# Patient Record
Sex: Female | Born: 1940 | Race: Black or African American | Hispanic: No | Smoking: Never smoker
Health system: Southern US, Community
[De-identification: ages and names within clinical notes are randomized; demographics above are authoritative.]

## PROBLEM LIST (undated history)

## (undated) DIAGNOSIS — M1990 Unspecified osteoarthritis, unspecified site: Secondary | ICD-10-CM

## (undated) DIAGNOSIS — M1711 Unilateral primary osteoarthritis, right knee: Secondary | ICD-10-CM

## (undated) DIAGNOSIS — E119 Type 2 diabetes mellitus without complications: Secondary | ICD-10-CM

## (undated) DIAGNOSIS — I10 Essential (primary) hypertension: Secondary | ICD-10-CM

## (undated) DIAGNOSIS — K589 Irritable bowel syndrome without diarrhea: Secondary | ICD-10-CM

## (undated) HISTORY — PX: MYOMECTOMY: SHX85

## (undated) HISTORY — PX: TRIGGER FINGER RELEASE: SHX641

## (undated) HISTORY — PX: EYE SURGERY: SHX253

## (undated) HISTORY — PX: BUNIONECTOMY: SHX129

## (undated) HISTORY — PX: ABDOMINAL HYSTERECTOMY: SHX81

## (undated) HISTORY — PX: BREAST SURGERY: SHX581

---

## 2010-01-19 DEATH — deceased

## 2017-07-03 ENCOUNTER — Other Ambulatory Visit: Payer: Self-pay | Admitting: Orthopedic Surgery

## 2017-07-19 NOTE — Pre-Procedure Instructions (Addendum)
Vanessa Hamilton  07/19/2017      [id number] - [place], [place] - [address] AT [facility] [address] [place] [address] Phone: [phone] Fax: [phone]    Your procedure is scheduled on Fri., August 02, 2017 from 9:30AM-11:15AM  Report to [facility] Admitting Entrance "A" at 7:30AM  Call this number if you have problems the morning of surgery:  [phone]   Remember:  No food or drinks after midnight on June 13th   Take these medicines the morning of surgery with A SIP OF WATER: AmLODipine (NORVASC) and Timolol (TIMOPTIC-XR)   7 days before surgery (6/7), stop taking all Other Aspirin Products, Vitamins, Fish oils, and Herbal medications. Also stop all NSAIDS i.e. Advil, Ibuprofen, Motrin, Aleve, Anaprox, Naproxen, BC and Goody Powders.  How to Manage Your Diabetes Before and After Surgery  Why is it important to control my blood sugar before and after surgery? . Improving blood sugar levels before and after surgery helps healing and can limit problems. . A way of improving blood sugar control is eating a healthy diet by: o  Eating less sugar and carbohydrates o  Increasing activity/exercise o  Talking with your doctor about reaching your blood sugar goals . High blood sugars (greater than 180 mg/dL) can raise your risk of infections and slow your recovery, so you will need to focus on controlling your diabetes during the weeks before surgery. . Make sure that the doctor who takes care of your diabetes knows about your planned surgery including the date and location.  How do I manage my blood sugar before surgery? . Check your blood sugar at least 4 times a day, starting 2 days before surgery, to make sure that the level is not too high or low. o Check your blood sugar the morning of your surgery when you wake up and every 2 hours until you get to the Short Stay unit. . If your  blood sugar is less than 70 mg/dL, you will need to treat for low blood sugar: o Do not take insulin. o Treat a low blood sugar (less than 70 mg/dL) with  cup of clear juice (cranberry or apple), 4 glucose tablets, OR glucose gel. Recheck blood sugar in 15 minutes after treatment (to make sure it is greater than 70 mg/dL). If your blood sugar is not greater than 70 mg/dL on recheck, call [phone] o  for further instructions. . Report your blood sugar to the short stay nurse when you get to Short Stay.  . If you are admitted to the hospital after surgery: o Your blood sugar will be checked by the staff and you will probably be given insulin after surgery (instead of oral diabetes medicines) to make sure you have good blood sugar levels. o The goal for blood sugar control after surgery is 80-180 mg/dL.  WHAT DO I DO ABOUT MY DIABETES MEDICATION?  Marland Kitchen Do not take MetFORMIN (GLUCOPHAGE) the morning of surgery.  . If your CBG is greater than 220 mg/dL, call us at [phone]  Reviewed and Endorsed by [facility] Patient Education Committee, August 2015    Do not wear jewelry, make-up or nail polish (fingers).  Do not wear lotions, powders, or perfumes, or deodorant.  Do not shave 48 hours prior to surgery.    Do not bring valuables to the hospital.  [facility] is not responsible for any belongings or  valuables.  Contacts, dentures or bridgework may not be worn into surgery.  Leave your suitcase in the car.  After surgery it may be brought to your room.  For patients admitted to the hospital, discharge time will be determined by your treatment team.  Patients discharged the day of surgery will not be allowed to drive home.   Special instructions:   [facility]- Preparing For Surgery  Before surgery, you can play an important role. Because skin is not sterile, your skin needs to be as free of germs as possible. You can reduce the number of germs on your skin by washing with CHG  (chlorahexidine gluconate) Soap before surgery.  CHG is an antiseptic cleaner which kills germs and bonds with the skin to continue killing germs even after washing.    Oral Hygiene is also important to reduce your risk of infection.  Remember - BRUSH YOUR TEETH THE MORNING OF SURGERY WITH YOUR REGULAR TOOTHPASTE  Please do not use if you have an allergy to CHG or antibacterial soaps. If your skin becomes reddened/irritated stop using the CHG.  Do not shave (including legs and underarms) for at least 48 hours prior to first CHG shower. It is OK to shave your face.  Please follow these instructions carefully.   1. Shower the NIGHT BEFORE SURGERY and the MORNING OF SURGERY with CHG.   2. If you chose to wash your hair, wash your hair first as usual with your normal shampoo.  3. After you shampoo, rinse your hair and body thoroughly to remove the shampoo.  4. Use CHG as you would any other liquid soap. You can apply CHG directly to the skin and wash gently with a scrungie or a clean washcloth.   5. Apply the CHG Soap to your body ONLY FROM THE NECK DOWN.  Do not use on open wounds or open sores. Avoid contact with your eyes, ears, mouth and genitals (private parts). Wash Face and genitals (private parts)  with your normal soap.  6. Wash thoroughly, paying special attention to the area where your surgery will be performed.  7. Thoroughly rinse your body with warm water from the neck down.  8. DO NOT shower/wash with your normal soap after using and rinsing off the CHG Soap.  9. Pat yourself dry with a CLEAN TOWEL.  10. Wear CLEAN PAJAMAS to bed the night before surgery, wear comfortable clothes the morning of surgery  11. Place CLEAN SHEETS on your bed the night of your first shower and DO NOT SLEEP WITH PETS.  Day of Surgery:  Do not apply any deodorants/lotions.  Please wear clean clothes to the hospital/surgery center.   Remember to brush your teeth WITH YOUR REGULAR  TOOTHPASTE.  Please read over the following fact sheets that you were given. Pain Booklet, Coughing and Deep Breathing, MRSA Information and Surgical Site Infection Prevention

## 2017-07-22 ENCOUNTER — Encounter (HOSPITAL_COMMUNITY): Payer: Self-pay

## 2017-07-22 ENCOUNTER — Other Ambulatory Visit: Payer: Self-pay

## 2017-07-22 ENCOUNTER — Ambulatory Visit (HOSPITAL_COMMUNITY)
Admission: RE | Admit: 2017-07-22 | Discharge: 2017-07-22 | Disposition: A | Discharge status: Home / Self Care | Payer: Medicare Other | Source: Ambulatory Visit | Attending: Orthopedic Surgery | Admitting: Orthopedic Surgery

## 2017-07-22 ENCOUNTER — Encounter (HOSPITAL_COMMUNITY)
Admission: RE | Admit: 2017-07-22 | Discharge: 2017-07-22 | Disposition: A | Discharge status: Home / Self Care | Payer: Medicare Other | Source: Ambulatory Visit | Attending: Orthopedic Surgery | Admitting: Orthopedic Surgery

## 2017-07-22 DIAGNOSIS — Z01818 Encounter for other preprocedural examination: Secondary | ICD-10-CM | POA: Diagnosis present

## 2017-07-22 DIAGNOSIS — I498 Other specified cardiac arrhythmias: Secondary | ICD-10-CM | POA: Diagnosis not present

## 2017-07-22 DIAGNOSIS — R9431 Abnormal electrocardiogram [ECG] [EKG]: Secondary | ICD-10-CM | POA: Diagnosis not present

## 2017-07-22 DIAGNOSIS — E119 Type 2 diabetes mellitus without complications: Secondary | ICD-10-CM | POA: Diagnosis not present

## 2017-07-22 HISTORY — DX: Essential (primary) hypertension: I10

## 2017-07-22 HISTORY — DX: Irritable bowel syndrome without diarrhea: K58.9

## 2017-07-22 HISTORY — DX: Unilateral primary osteoarthritis, right knee: M17.11

## 2017-07-22 HISTORY — DX: Unspecified osteoarthritis, unspecified site: M19.90

## 2017-07-22 HISTORY — DX: Type 2 diabetes mellitus without complications: E11.9

## 2017-07-22 LAB — URINALYSIS, ROUTINE W REFLEX MICROSCOPIC
Bilirubin Urine: NEGATIVE
GLUCOSE, UA: NEGATIVE mg/dL
Hgb urine dipstick: NEGATIVE
KETONES UR: NEGATIVE mg/dL
Nitrite: NEGATIVE
PROTEIN: NEGATIVE mg/dL
Specific Gravity, Urine: 1.02 (ref 1.005–1.030)
pH: 5 (ref 5.0–8.0)

## 2017-07-22 LAB — BASIC METABOLIC PANEL
Anion gap: 8 (ref 5–15)
BUN: 7 mg/dL (ref 6–20)
CALCIUM: 9.9 mg/dL (ref 8.9–10.3)
CO2: 28 mmol/L (ref 22–32)
CREATININE: 0.64 mg/dL (ref 0.44–1.00)
Chloride: 108 mmol/L (ref 101–111)
Glucose, Bld: 107 mg/dL — ABNORMAL HIGH (ref 65–99)
Potassium: 3.7 mmol/L (ref 3.5–5.1)
SODIUM: 144 mmol/L (ref 135–145)

## 2017-07-22 LAB — CBC WITH DIFFERENTIAL/PLATELET
Abs Immature Granulocytes: 0 10*3/uL (ref 0.0–0.1)
BASOS ABS: 0.1 10*3/uL (ref 0.0–0.1)
BASOS PCT: 1 %
EOS ABS: 0.2 10*3/uL (ref 0.0–0.7)
Eosinophils Relative: 3 %
HCT: 41.2 % (ref 36.0–46.0)
Hemoglobin: 13.3 g/dL (ref 12.0–15.0)
IMMATURE GRANULOCYTES: 0 %
Lymphocytes Relative: 48 %
Lymphs Abs: 2.6 10*3/uL (ref 0.7–4.0)
MCH: 29.6 pg (ref 26.0–34.0)
MCHC: 32.3 g/dL (ref 30.0–36.0)
MCV: 91.6 fL (ref 78.0–100.0)
MONOS PCT: 7 %
Monocytes Absolute: 0.4 10*3/uL (ref 0.1–1.0)
Neutro Abs: 2.2 10*3/uL (ref 1.7–7.7)
Neutrophils Relative %: 41 %
PLATELETS: 276 10*3/uL (ref 150–400)
RBC: 4.5 MIL/uL (ref 3.87–5.11)
RDW: 13.2 % (ref 11.5–15.5)
WBC: 5.3 10*3/uL (ref 4.0–10.5)

## 2017-07-22 LAB — SURGICAL PCR SCREEN
MRSA, PCR: NEGATIVE
Staphylococcus aureus: NEGATIVE

## 2017-07-22 LAB — APTT: APTT: 27 s (ref 24–36)

## 2017-07-22 LAB — ABO/RH: ABO/RH(D): O POS

## 2017-07-22 LAB — TYPE AND SCREEN
ABO/RH(D): O POS
Antibody Screen: NEGATIVE

## 2017-07-22 LAB — GLUCOSE, CAPILLARY: GLUCOSE-CAPILLARY: 98 mg/dL (ref 65–99)

## 2017-07-22 LAB — HEMOGLOBIN A1C
Hgb A1c MFr Bld: 6.3 % — ABNORMAL HIGH (ref 4.8–5.6)
Mean Plasma Glucose: 134.11 mg/dL

## 2017-07-22 LAB — PROTIME-INR
INR: 0.98
PROTHROMBIN TIME: 12.9 s (ref 11.4–15.2)

## 2017-07-22 NOTE — Progress Notes (Signed)
PCP - [facility]inda [facility] [place]  Cardiologist - Denies  Chest x-ray - 07/22/17  EKG - 07/22/17  Stress Test - Denies  ECHO - Denies  Cardiac Cath - Denies  Sleep Study - Denies CPAP - None  LABS- 07/22/17: CBC w/D, BMP, PT, PTT, T/S, UA  ASA- Denies  HA1C- 07/22/17 Fasting Blood Sugar - 90-117, Today, 98 Checks Blood Sugar __3___ times a week  Anesthesia- No  Pt denies having chest pain, sob, or fever at this time. All instructions explained to the pt, with a verbal understanding of the material. Pt agrees to go over the instructions while at home for a better understanding. The opportunity to ask questions was provided.

## 2017-07-22 NOTE — Progress Notes (Signed)
Cathy from Dr. Wadie Lessenowan's office made aware of abnormal urine results.

## 2017-07-30 ENCOUNTER — Telehealth (HOSPITAL_COMMUNITY): Payer: Self-pay | Admitting: *Deleted

## 2017-07-30 ENCOUNTER — Ambulatory Visit (INDEPENDENT_AMBULATORY_CARE_PROVIDER_SITE_OTHER): Payer: Medicare Other | Admitting: Cardiology

## 2017-07-30 ENCOUNTER — Encounter: Payer: Self-pay | Admitting: Cardiology

## 2017-07-30 VITALS — BP 132/86 | HR 53 | Ht 66.0 in | Wt 203.0 lb

## 2017-07-30 DIAGNOSIS — R9431 Abnormal electrocardiogram [ECG] [EKG]: Secondary | ICD-10-CM

## 2017-07-30 DIAGNOSIS — E782 Mixed hyperlipidemia: Secondary | ICD-10-CM

## 2017-07-30 DIAGNOSIS — O24919 Unspecified diabetes mellitus in pregnancy, unspecified trimester: Secondary | ICD-10-CM

## 2017-07-30 DIAGNOSIS — M1711 Unilateral primary osteoarthritis, right knee: Secondary | ICD-10-CM | POA: Diagnosis present

## 2017-07-30 DIAGNOSIS — Z01810 Encounter for preprocedural cardiovascular examination: Secondary | ICD-10-CM | POA: Diagnosis not present

## 2017-07-30 DIAGNOSIS — I10 Essential (primary) hypertension: Secondary | ICD-10-CM

## 2017-07-30 NOTE — H&P (Signed)
TOTAL KNEE ADMISSION H&P  Patient is being admitted for right total knee arthroplasty.  Subjective:  Chief Complaint:right knee pain.  HPI: Vanessa Hamilton, 77 y.o. female, has a history of pain and functional disability in the right knee due to arthritis and has failed non-surgical conservative treatments for greater than 12 weeks to includeNSAID's and/or analgesics, corticosteriod injections, flexibility and strengthening excercises, use of assistive devices and activity modification.  Onset of symptoms was gradual, starting several years ago with gradually worsening course since that time. The patient noted no past surgery on the right knee(s).  Patient currently rates pain in the right knee(s) at 10 out of 10 with activity. Patient has night pain, worsening of pain with activity and weight bearing, pain that interferes with activities of daily living, pain with passive range of motion and crepitus.  Patient has evidence of joint space narrowing by imaging studies.   There is no active infection.  There are no active problems to display for this patient.  Past Medical History:  Diagnosis Date  . Arthritis   . Diabetes mellitus without complication (HCC)    Type II  . Hypertension   . IBS (irritable bowel syndrome)   . Osteoarthritis of right knee     Past Surgical History:  Procedure Laterality Date  . ABDOMINAL HYSTERECTOMY    . BREAST SURGERY     Left biopsy x2  . BUNIONECTOMY     Left Great Toe  . EYE SURGERY     Left Cataract  . MYOMECTOMY    . TRIGGER FINGER RELEASE     Right hand    No current facility-administered medications for this encounter.    Current Outpatient Medications  Medication Sig Dispense Refill Last Dose  . acetaminophen (TYLENOL 8 HOUR ARTHRITIS PAIN) 650 MG CR tablet Take 650 mg by mouth at bedtime as needed for pain.     Marland Kitchen amLODipine (NORVASC) 5 MG tablet Take 5 mg by mouth daily.     . metFORMIN (GLUCOPHAGE) 500 MG tablet Take 1,000 mg by mouth 2 (two)  times daily.     . Multiple Vitamin (MULTIVITAMIN WITH MINERALS) TABS tablet Take 1 tablet by mouth daily.     . Omega-3 Fatty Acids (FISH OIL) 1200 MG CAPS Take 1,200 mg by mouth daily.     . pravastatin (PRAVACHOL) 80 MG tablet Take 80 mg by mouth at bedtime.     . timolol (TIMOPTIC-XR) 0.5 % ophthalmic gel-forming Place 1 drop into the right eye daily.     . Travoprost, BAK Free, (TRAVATAN) 0.004 % SOLN ophthalmic solution Place 1 drop into both eyes at bedtime.      Allergies  Allergen Reactions  . Losartan Swelling  . Ramipril Swelling    Social History   Tobacco Use  . Smoking status: Never Smoker  . Smokeless tobacco: Former Neurosurgeon    Types: Snuff  Substance Use Topics  . Alcohol use: Not Currently    No family history on file.   Review of Systems  Constitutional: Negative.   HENT: Positive for sinus pain.   Eyes: Negative.   Respiratory: Negative.   Cardiovascular:       HTN  Gastrointestinal: Negative.   Genitourinary: Negative.   Musculoskeletal: Positive for joint pain.  Skin: Negative.   Neurological: Negative.   Endo/Heme/Allergies:       Diabetes  Psychiatric/Behavioral: Negative.     Objective:  Physical Exam  Constitutional: She is oriented to person, place, and time. She appears  well-developed and well-nourished.  HENT:  Head: Normocephalic and atraumatic.  Neck: Normal range of motion. Neck supple.  Cardiovascular: Intact distal pulses.  Respiratory: Effort normal.  Musculoskeletal: She exhibits tenderness.  the patient has good strength and good range of motion in the left knee.  The patient's right knee has a range from 5 to 110.  She does have mild swelling.  No erythema or warmth.  Moderate crepitance with range of motion.  She has tenderness over the lateral joint line.  No instability with valgus or varus stress.  Her calves are soft and nontender.  Neurological: She is alert and oriented to person, place, and time.  Skin: Skin is warm and  dry.  Psychiatric: She has a normal mood and affect. Her behavior is normal. Judgment and thought content normal.    Vital signs in last 24 hours:    Labs:   Estimated body mass index is 32.62 kg/m as calculated from the following:   Height as of 07/22/17: 5\' 6"  (1.676 m).   Weight as of 07/22/17: 91.7 kg (202 lb 1.6 oz).   Imaging Review Plain radiographs demonstrate bilateral AP weightbearing, bilateral Rosenberg, lateral and sunrise views of the right and left knees are taken and reviewed in office today.  This demonstrates near end-stage arthritis lateral compartment right knee.    Preoperative templating of the joint replacement has been completed, documented, and submitted to the Operating Room personnel in order to optimize intra-operative equipment management.   Anticipated LOS equal to or greater than 2 midnights due to - Age 78 and older with one or more of the following:  - Obesity  - Expected need for hospital services (PT, OT, Nursing) required for safe  discharge  - Anticipated need for postoperative skilled nursing care or inpatient rehab  - Active co-morbidities: Diabetes   Assessment/Plan:  End stage arthritis, right knee   The patient history, physical examination, clinical judgment of the provider and imaging studies are consistent with end stage degenerative joint disease of the right knee(s) and total knee arthroplasty is deemed medically necessary. The treatment options including medical management, injection therapy arthroscopy and arthroplasty were discussed at length. The risks and benefits of total knee arthroplasty were presented and reviewed. The risks due to aseptic loosening, infection, stiffness, patella tracking problems, thromboembolic complications and other imponderables were discussed. The patient acknowledged the explanation, agreed to proceed with the plan and consent was signed. Patient is being admitted for inpatient treatment for surgery, pain  control, PT, OT, prophylactic antibiotics, VTE prophylaxis, progressive ambulation and ADL's and discharge planning. The patient is planning to be discharged home with home health services.

## 2017-07-30 NOTE — Progress Notes (Signed)
Cardiology Office Note:    Date:  07/30/2017   ID:  Vanessa Hamilton, DOB 13-May-1940, MRN [id number]  PCP:  Nicolasa Ducking, MD  Cardiologist:  Garwin Brothers, MD   Referring MD: Nicolasa Ducking, MD    ASSESSMENT:    1. Pre-operative cardiovascular examination   2. Essential hypertension   3. Diabetes mellitus affecting pregnancy, unspecified trimester   4. Mixed hyperlipidemia   5. Abnormal EKG    PLAN:    In order of problems listed above:  1. Primary prevention stressed with the patient.  Importance of compliance with diet and medications stressed and she vocalized understanding.  Weight reduction was stressed.  Her blood pressure is stable. 2. Diet was discussed with dyslipidemia and diabetes mellitus and she vocalized understanding.  These issues are managed by her primary care physician. 3. In view of multiple risk factors for coronary artery disease and sedentary lifestyle, she will undergo Lexiscan sestamibi.  If this is negative she is not at high risk for coronary events during the aforementioned surgery.  Meticulous hemodynamic monitoring will further reduce the risk of coronary events.  She will be seen in follow-up on a as needed basis only.  Her other healthcare maintenance issues mentioned above will be managed by her primary care physician.   Medication Adjustments/Labs and Tests Ordered: Current medicines are reviewed at length with the patient today.  Concerns regarding medicines are outlined above.  Orders Placed This Encounter  Procedures  . MYOCARDIAL PERFUSION IMAGING   No orders of the defined types were placed in this encounter.    History of Present Illness:    Vanessa Hamilton is a 77 y.o. female who is being seen today for the evaluation of preop cardiovascular risk assessment at the request of Shoffeitt, John, MD.  Patient is a pleasant 77 year old female.  She has past medical history of essential hypertension diabetes mellitus and dyslipidemia.  She is  planning to undergo knee surgery.  Obviously she leads a very sedentary lifestyle.  She denies any chest pain orthopnea or PND but she is very sedentary.  She was evaluated by anesthesia for preop assessment and had an abnormal EKG and therefore sent here for evaluation.  No history of syncope.  At the time of my evaluation, the patient is alert awake oriented and in no distress.  Past Medical History:  Diagnosis Date  . Arthritis   . Diabetes mellitus without complication (HCC)    Type II  . Hypertension   . IBS (irritable bowel syndrome)   . Osteoarthritis of right knee     Past Surgical History:  Procedure Laterality Date  . ABDOMINAL HYSTERECTOMY    . BREAST SURGERY     Left biopsy x2  . BUNIONECTOMY     Left Great Toe  . EYE SURGERY     Left Cataract  . MYOMECTOMY    . TRIGGER FINGER RELEASE     Right hand    Current Medications: Current Meds  Medication Sig  . acetaminophen (TYLENOL 8 HOUR ARTHRITIS PAIN) 650 MG CR tablet Take 650 mg by mouth at bedtime as needed for pain.  Marland Kitchen amLODipine (NORVASC) 5 MG tablet Take 5 mg by mouth daily.  . metFORMIN (GLUCOPHAGE) 500 MG tablet Take 1,000 mg by mouth 2 (two) times daily.  . Multiple Vitamin (MULTIVITAMIN WITH MINERALS) TABS tablet Take 1 tablet by mouth daily.  . Omega-3 Fatty Acids (FISH OIL) 1200 MG CAPS Take 1,200 mg by mouth daily.  . pravastatin (PRAVACHOL)  80 MG tablet Take 80 mg by mouth at bedtime.  . timolol (TIMOPTIC-XR) 0.5 % ophthalmic gel-forming Place 1 drop into the right eye daily.  . Travoprost, BAK Free, (TRAVATAN) 0.004 % SOLN ophthalmic solution Place 1 drop into both eyes at bedtime.     Allergies:   Atorvastatin; Losartan; and Ramipril   Social History   Socioeconomic History  . Marital status: Widowed    Spouse name: Not on file  . Number of children: Not on file  . Years of education: Not on file  . Highest education level: Not on file  Occupational History  . Not on file  Social Needs  .  Financial resource strain: Not on file  . Food insecurity:    Worry: Not on file    Inability: Not on file  . Transportation needs:    Medical: Not on file    Non-medical: Not on file  Tobacco Use  . Smoking status: Never Smoker  . Smokeless tobacco: Former NeurosurgeonUser    Types: Snuff  Substance and Sexual Activity  . Alcohol use: Not Currently  . Drug use: Never  . Sexual activity: Not on file  Lifestyle  . Physical activity:    Days per week: Not on file    Minutes per session: Not on file  . Stress: Not on file  Relationships  . Social connections:    Talks on phone: Not on file    Gets together: Not on file    Attends religious service: Not on file    Active member of club or organization: Not on file    Attends meetings of clubs or organizations: Not on file    Relationship status: Not on file  Other Topics Concern  . Not on file  Social History Narrative  . Not on file     Family History: The patient's family history includes Breast cancer in her sister.  ROS:   Please see the history of present illness.    All other systems reviewed and are negative.  EKGs/Labs/Other Studies Reviewed:    The following studies were reviewed today: EKG reveals sinus rhythm poor anterior forces and nonspecific ST-T changes.   Recent Labs: 07/22/2017: BUN 7; Creatinine, Ser 0.64; Hemoglobin 13.3; Platelets 276; Potassium 3.7; Sodium 144  Recent Lipid Panel No results found for: CHOL, TRIG, HDL, CHOLHDL, VLDL, LDLCALC, LDLDIRECT  Physical Exam:    VS:  BP 132/86 (BP Location: Right Arm, Patient Position: Sitting, Cuff Size: Normal)   Pulse (!) 53   Ht 5\' 6"  (1.676 m)   Wt 203 lb (92.1 kg)   SpO2 98%   BMI 32.77 kg/m     Wt Readings from Last 3 Encounters:  07/30/17 203 lb (92.1 kg)  07/22/17 202 lb 1.6 oz (91.7 kg)     GEN: Patient is in no acute distress HEENT: Normal NECK: No JVD; No carotid bruits LYMPHATICS: No lymphadenopathy CARDIAC: S1 S2 regular, 2/6 systolic  murmur at the apex. RESPIRATORY:  Clear to auscultation without rales, wheezing or rhonchi  ABDOMEN: Soft, non-tender, non-distended MUSCULOSKELETAL:  No edema; No deformity  SKIN: Warm and dry NEUROLOGIC:  Alert and oriented x 3 PSYCHIATRIC:  Normal affect    Signed, Garwin Brothersajan R Revankar, MD  07/30/2017 9:57 AM    [facility]

## 2017-07-30 NOTE — Telephone Encounter (Signed)
Patient given detailed instructions per Myocardial Perfusion Study Information Sheet for the test on 07/31/17 at 1000. Patient notified to arrive 15 minutes early and that it is imperative to arrive on time for appointment to keep from having the test rescheduled.  If you need to cancel or reschedule your appointment, please call the office within 24 hours of your appointment. . Patient verbalized understanding.E.Kubak,CNMT

## 2017-07-30 NOTE — Patient Instructions (Signed)
Medication Instructions:  Your physician recommends that you continue on your current medications as directed. Please refer to the Current Medication list given to you today.  Labwork: None  Testing/Procedures: Your physician has requested that you have a lexiscan myoview. For further information please visit [email or web address]. Please follow instruction sheet, as given.  Follow-Up: Your physician recommends that you schedule a follow-up appointment in: as needed  Any Other Special Instructions Will Be Listed Below (If Applicable).     If you need a refill on your cardiac medications before your next appointment, please call your pharmacy.   [facility]  Ashley A, RN, BSN  

## 2017-07-30 NOTE — Telephone Encounter (Signed)
Left message on voicemail in reference to upcoming appointment scheduled for 07/31/17. Cell # does not have vmail set up.Phone number given for a call back so details instructions can be given. Davis Ambrosini, Adelene IdlerCynthia W

## 2017-07-31 ENCOUNTER — Ambulatory Visit (HOSPITAL_BASED_OUTPATIENT_CLINIC_OR_DEPARTMENT_OTHER): Payer: Medicare Other

## 2017-07-31 VITALS — Ht 66.0 in | Wt 203.0 lb

## 2017-07-31 DIAGNOSIS — Z01810 Encounter for preprocedural cardiovascular examination: Secondary | ICD-10-CM

## 2017-07-31 DIAGNOSIS — R9431 Abnormal electrocardiogram [ECG] [EKG]: Secondary | ICD-10-CM

## 2017-07-31 LAB — MYOCARDIAL PERFUSION IMAGING
CHL CUP NUCLEAR SRS: 11
CSEPPHR: 84 {beats}/min
LV dias vol: 78 mL (ref 46–106)
LV sys vol: 30 mL
RATE: 0.34
Rest HR: 68 {beats}/min
SDS: 0
SSS: 8
TID: 0.95

## 2017-07-31 MED ORDER — REGADENOSON 0.4 MG/5ML IV SOLN
0.4000 mg | Freq: Once | INTRAVENOUS | Status: AC
  Administered 2017-07-31: 0.4 mg via INTRAVENOUS

## 2017-07-31 MED ORDER — TECHNETIUM TC 99M TETROFOSMIN IV KIT
10.5000 | PACK | Freq: Once | INTRAVENOUS | Status: AC | PRN
  Administered 2017-07-31: 10.5 via INTRAVENOUS
  Filled 2017-07-31: qty 11

## 2017-07-31 MED ORDER — TECHNETIUM TC 99M TETROFOSMIN IV KIT
32.5000 | PACK | Freq: Once | INTRAVENOUS | Status: AC | PRN
  Administered 2017-07-31: 32.5 via INTRAVENOUS
  Filled 2017-07-31: qty 33

## 2017-08-01 ENCOUNTER — Telehealth: Payer: Self-pay

## 2017-08-01 MED ORDER — TRANEXAMIC ACID 1000 MG/10ML IV SOLN
2000.0000 mg | INTRAVENOUS | Status: AC
  Administered 2017-08-02: 2000 mg via TOPICAL
  Filled 2017-08-01: qty 20

## 2017-08-01 MED ORDER — BUPIVACAINE LIPOSOME 1.3 % IJ SUSP
20.0000 mL | Freq: Once | INTRAMUSCULAR | Status: AC
  Administered 2017-08-02: 20 mL
  Filled 2017-08-01: qty 20

## 2017-08-01 MED ORDER — TRANEXAMIC ACID 1000 MG/10ML IV SOLN
1000.0000 mg | INTRAVENOUS | Status: AC
  Administered 2017-08-02: 1000 mg via INTRAVENOUS
  Filled 2017-08-01: qty 1100

## 2017-08-01 NOTE — Telephone Encounter (Signed)
Patient was notified of her resutls

## 2017-08-02 ENCOUNTER — Inpatient Hospital Stay (HOSPITAL_COMMUNITY)
Admission: AD | Admit: 2017-08-02 | Discharge: 2017-08-05 | DRG: 470 | Disposition: A | Discharge status: Home Health | Payer: Medicare Other | Source: Ambulatory Visit | Attending: Orthopedic Surgery | Admitting: Orthopedic Surgery

## 2017-08-02 ENCOUNTER — Encounter (HOSPITAL_COMMUNITY)
Admission: AD | Disposition: A | Discharge status: Home Health | Payer: Self-pay | Source: Ambulatory Visit | Attending: Orthopedic Surgery

## 2017-08-02 ENCOUNTER — Ambulatory Visit (HOSPITAL_COMMUNITY): Payer: Medicare Other | Admitting: Anesthesiology

## 2017-08-02 ENCOUNTER — Encounter (HOSPITAL_COMMUNITY): Payer: Self-pay

## 2017-08-02 ENCOUNTER — Ambulatory Visit (HOSPITAL_COMMUNITY): Payer: Medicare Other | Admitting: Emergency Medicine

## 2017-08-02 DIAGNOSIS — K589 Irritable bowel syndrome without diarrhea: Secondary | ICD-10-CM | POA: Diagnosis present

## 2017-08-02 DIAGNOSIS — E669 Obesity, unspecified: Secondary | ICD-10-CM | POA: Diagnosis present

## 2017-08-02 DIAGNOSIS — Z7984 Long term (current) use of oral hypoglycemic drugs: Secondary | ICD-10-CM | POA: Diagnosis not present

## 2017-08-02 DIAGNOSIS — D62 Acute posthemorrhagic anemia: Secondary | ICD-10-CM | POA: Diagnosis not present

## 2017-08-02 DIAGNOSIS — Z6832 Body mass index (BMI) 32.0-32.9, adult: Secondary | ICD-10-CM

## 2017-08-02 DIAGNOSIS — I10 Essential (primary) hypertension: Secondary | ICD-10-CM | POA: Diagnosis present

## 2017-08-02 DIAGNOSIS — E119 Type 2 diabetes mellitus without complications: Secondary | ICD-10-CM | POA: Diagnosis present

## 2017-08-02 DIAGNOSIS — M1711 Unilateral primary osteoarthritis, right knee: Secondary | ICD-10-CM | POA: Diagnosis present

## 2017-08-02 HISTORY — PX: TOTAL KNEE ARTHROPLASTY: SHX125

## 2017-08-02 LAB — GLUCOSE, CAPILLARY
GLUCOSE-CAPILLARY: 109 mg/dL — AB (ref 65–99)
GLUCOSE-CAPILLARY: 109 mg/dL — AB (ref 65–99)
GLUCOSE-CAPILLARY: 205 mg/dL — AB (ref 65–99)
GLUCOSE-CAPILLARY: 166 mg/dL — AB (ref 65–99)
Glucose-Capillary: 113 mg/dL — ABNORMAL HIGH (ref 65–99)

## 2017-08-02 LAB — HEMOGLOBIN A1C
HEMOGLOBIN A1C: 6.6 % — AB (ref 4.8–5.6)
Mean Plasma Glucose: 142.72 mg/dL

## 2017-08-02 SURGERY — ARTHROPLASTY, KNEE, TOTAL
Anesthesia: Monitor Anesthesia Care | Site: Knee | Laterality: Right

## 2017-08-02 MED ORDER — METHOCARBAMOL 500 MG PO TABS
500.0000 mg | ORAL_TABLET | Freq: Four times a day (QID) | ORAL | Status: DC | PRN
  Administered 2017-08-02 – 2017-08-04 (×4): 500 mg via ORAL
  Filled 2017-08-02 (×5): qty 1

## 2017-08-02 MED ORDER — FLEET ENEMA 7-19 GM/118ML RE ENEM: 1.0000 | ENEMA | Freq: Once | RECTAL | Status: DC | PRN

## 2017-08-02 MED ORDER — DEXTROSE 5 % IV SOLN
INTRAVENOUS | Status: DC | PRN
  Administered 2017-08-02: 20 ug/min via INTRAVENOUS

## 2017-08-02 MED ORDER — DOCUSATE SODIUM 100 MG PO CAPS
100.0000 mg | ORAL_CAPSULE | Freq: Two times a day (BID) | ORAL | Status: DC
  Administered 2017-08-04 – 2017-08-05 (×2): 100 mg via ORAL
  Filled 2017-08-02 (×5): qty 1

## 2017-08-02 MED ORDER — MENTHOL 3 MG MT LOZG: 1.0000 | LOZENGE | OROMUCOSAL | Status: DC | PRN

## 2017-08-02 MED ORDER — OXYCODONE HCL 5 MG PO TABS
5.0000 mg | ORAL_TABLET | ORAL | Status: DC | PRN
  Administered 2017-08-02 – 2017-08-03 (×2): 10 mg via ORAL
  Administered 2017-08-04: 5 mg via ORAL
  Filled 2017-08-02 (×4): qty 2
  Filled 2017-08-02: qty 1

## 2017-08-02 MED ORDER — ONDANSETRON HCL 4 MG/2ML IJ SOLN: 4.0000 mg | Freq: Four times a day (QID) | INTRAMUSCULAR | Status: DC | PRN

## 2017-08-02 MED ORDER — ZOLPIDEM TARTRATE 5 MG PO TABS
5.0000 mg | ORAL_TABLET | Freq: Every evening | ORAL | Status: DC | PRN
  Filled 2017-08-02: qty 1

## 2017-08-02 MED ORDER — ASPIRIN EC 81 MG PO TBEC: 81.0000 mg | DELAYED_RELEASE_TABLET | Freq: Two times a day (BID) | ORAL | 0 refills | Status: AC

## 2017-08-02 MED ORDER — KCL IN DEXTROSE-NACL 20-5-0.45 MEQ/L-%-% IV SOLN
INTRAVENOUS | Status: DC
  Administered 2017-08-02 – 2017-08-03 (×2): via INTRAVENOUS
  Filled 2017-08-02 (×3): qty 1000

## 2017-08-02 MED ORDER — PROPOFOL 10 MG/ML IV BOLUS
INTRAVENOUS | Status: AC
  Filled 2017-08-02: qty 20

## 2017-08-02 MED ORDER — METFORMIN HCL 500 MG PO TABS
1000.0000 mg | ORAL_TABLET | Freq: Two times a day (BID) | ORAL | Status: DC
  Administered 2017-08-02 – 2017-08-05 (×7): 1000 mg via ORAL
  Filled 2017-08-02 (×7): qty 2

## 2017-08-02 MED ORDER — MIDAZOLAM HCL 2 MG/2ML IJ SOLN
INTRAMUSCULAR | Status: AC
  Administered 2017-08-02: 0.5 mg via INTRAVENOUS
  Filled 2017-08-02: qty 2

## 2017-08-02 MED ORDER — SODIUM CHLORIDE 0.9 % IJ SOLN
INTRAMUSCULAR | Status: DC | PRN
  Administered 2017-08-02: 50 mL

## 2017-08-02 MED ORDER — GABAPENTIN 300 MG PO CAPS
300.0000 mg | ORAL_CAPSULE | Freq: Three times a day (TID) | ORAL | Status: DC
  Administered 2017-08-02 – 2017-08-05 (×10): 300 mg via ORAL
  Filled 2017-08-02 (×10): qty 1

## 2017-08-02 MED ORDER — CELECOXIB 200 MG PO CAPS
200.0000 mg | ORAL_CAPSULE | Freq: Two times a day (BID) | ORAL | Status: DC
  Administered 2017-08-02 – 2017-08-05 (×6): 200 mg via ORAL
  Filled 2017-08-02 (×6): qty 1

## 2017-08-02 MED ORDER — OXYCODONE-ACETAMINOPHEN 5-325 MG PO TABS: 1.0000 | ORAL_TABLET | ORAL | 0 refills | Status: AC | PRN

## 2017-08-02 MED ORDER — PROPOFOL 500 MG/50ML IV EMUL
INTRAVENOUS | Status: DC | PRN
  Administered 2017-08-02: 75 ug/kg/min via INTRAVENOUS

## 2017-08-02 MED ORDER — OXYCODONE HCL 5 MG PO TABS: 5.0000 mg | ORAL_TABLET | Freq: Once | ORAL | Status: DC | PRN

## 2017-08-02 MED ORDER — FENTANYL CITRATE (PF) 250 MCG/5ML IJ SOLN
INTRAMUSCULAR | Status: AC
  Filled 2017-08-02: qty 5

## 2017-08-02 MED ORDER — BISACODYL 5 MG PO TBEC
5.0000 mg | DELAYED_RELEASE_TABLET | Freq: Every day | ORAL | Status: DC | PRN
Start: 2017-08-02 — End: 2017-08-05

## 2017-08-02 MED ORDER — BUPIVACAINE-EPINEPHRINE 0.25% -1:200000 IJ SOLN
INTRAMUSCULAR | Status: AC
  Filled 2017-08-02: qty 1

## 2017-08-02 MED ORDER — MIDAZOLAM HCL 2 MG/2ML IJ SOLN
0.5000 mg | Freq: Once | INTRAMUSCULAR | Status: AC
  Administered 2017-08-02: 0.5 mg via INTRAVENOUS

## 2017-08-02 MED ORDER — CEFAZOLIN SODIUM-DEXTROSE 2-4 GM/100ML-% IV SOLN
2.0000 g | INTRAVENOUS | Status: AC
  Administered 2017-08-02: 2 g via INTRAVENOUS
  Filled 2017-08-02: qty 100

## 2017-08-02 MED ORDER — SODIUM CHLORIDE 0.9 % IR SOLN
Status: DC | PRN
  Administered 2017-08-02: 3000 mL

## 2017-08-02 MED ORDER — OXYCODONE HCL 5 MG/5ML PO SOLN: 5.0000 mg | Freq: Once | ORAL | Status: DC | PRN

## 2017-08-02 MED ORDER — ASPIRIN 81 MG PO CHEW
81.0000 mg | CHEWABLE_TABLET | Freq: Two times a day (BID) | ORAL | Status: DC
  Administered 2017-08-02 – 2017-08-05 (×6): 81 mg via ORAL
  Filled 2017-08-02 (×6): qty 1

## 2017-08-02 MED ORDER — ONDANSETRON HCL 4 MG/2ML IJ SOLN
INTRAMUSCULAR | Status: AC
  Filled 2017-08-02: qty 2

## 2017-08-02 MED ORDER — METHOCARBAMOL 1000 MG/10ML IJ SOLN
500.0000 mg | Freq: Four times a day (QID) | INTRAVENOUS | Status: DC | PRN
  Filled 2017-08-02: qty 5

## 2017-08-02 MED ORDER — ACETAMINOPHEN 325 MG PO TABS: 325.0000 mg | ORAL_TABLET | Freq: Four times a day (QID) | ORAL | Status: DC | PRN

## 2017-08-02 MED ORDER — PHENOL 1.4 % MT LIQD: 1.0000 | OROMUCOSAL | Status: DC | PRN

## 2017-08-02 MED ORDER — MIDAZOLAM HCL 2 MG/2ML IJ SOLN
INTRAMUSCULAR | Status: AC
  Filled 2017-08-02: qty 2

## 2017-08-02 MED ORDER — CHLORHEXIDINE GLUCONATE 4 % EX LIQD: 60.0000 mL | Freq: Once | CUTANEOUS | Status: DC

## 2017-08-02 MED ORDER — LACTATED RINGERS IV SOLN
INTRAVENOUS | Status: DC
  Administered 2017-08-02 (×2): via INTRAVENOUS

## 2017-08-02 MED ORDER — ONDANSETRON HCL 4 MG PO TABS
4.0000 mg | ORAL_TABLET | Freq: Four times a day (QID) | ORAL | Status: DC | PRN
Start: 2017-08-02 — End: 2017-08-05

## 2017-08-02 MED ORDER — TIZANIDINE HCL 2 MG PO TABS: 2.0000 mg | ORAL_TABLET | Freq: Four times a day (QID) | ORAL | 0 refills | Status: AC | PRN

## 2017-08-02 MED ORDER — LATANOPROST 0.005 % OP SOLN
1.0000 [drp] | Freq: Every day | OPHTHALMIC | Status: DC
  Administered 2017-08-02 – 2017-08-04 (×3): 1 [drp] via OPHTHALMIC
  Filled 2017-08-02: qty 2.5

## 2017-08-02 MED ORDER — FENTANYL CITRATE (PF) 100 MCG/2ML IJ SOLN
INTRAMUSCULAR | Status: AC
  Administered 2017-08-02: 50 ug via INTRAVENOUS
  Filled 2017-08-02: qty 2

## 2017-08-02 MED ORDER — METOCLOPRAMIDE HCL 5 MG PO TABS: 5.0000 mg | ORAL_TABLET | Freq: Three times a day (TID) | ORAL | Status: DC | PRN

## 2017-08-02 MED ORDER — FENTANYL CITRATE (PF) 100 MCG/2ML IJ SOLN
50.0000 ug | Freq: Once | INTRAMUSCULAR | Status: AC
  Administered 2017-08-02: 50 ug via INTRAVENOUS

## 2017-08-02 MED ORDER — POLYETHYLENE GLYCOL 3350 17 G PO PACK: 17.0000 g | PACK | Freq: Every day | ORAL | Status: DC | PRN

## 2017-08-02 MED ORDER — ONDANSETRON HCL 4 MG/2ML IJ SOLN
INTRAMUSCULAR | Status: DC | PRN
  Administered 2017-08-02: 4 mg via INTRAVENOUS

## 2017-08-02 MED ORDER — AMLODIPINE BESYLATE 5 MG PO TABS
5.0000 mg | ORAL_TABLET | Freq: Every day | ORAL | Status: DC
  Administered 2017-08-03 – 2017-08-05 (×3): 5 mg via ORAL
  Filled 2017-08-02 (×3): qty 1

## 2017-08-02 MED ORDER — DIPHENHYDRAMINE HCL 12.5 MG/5ML PO ELIX: 12.5000 mg | ORAL_SOLUTION | ORAL | Status: DC | PRN

## 2017-08-02 MED ORDER — PANTOPRAZOLE SODIUM 40 MG PO TBEC
40.0000 mg | DELAYED_RELEASE_TABLET | Freq: Every day | ORAL | Status: DC
  Administered 2017-08-03 – 2017-08-05 (×3): 40 mg via ORAL
  Filled 2017-08-02 (×3): qty 1

## 2017-08-02 MED ORDER — TRANEXAMIC ACID 1000 MG/10ML IV SOLN
1000.0000 mg | Freq: Once | INTRAVENOUS | Status: AC
  Administered 2017-08-02: 1000 mg via INTRAVENOUS
  Filled 2017-08-02: qty 10

## 2017-08-02 MED ORDER — 0.9 % SODIUM CHLORIDE (POUR BTL) OPTIME
TOPICAL | Status: DC | PRN
  Administered 2017-08-02: 1000 mL

## 2017-08-02 MED ORDER — FENTANYL CITRATE (PF) 100 MCG/2ML IJ SOLN: 25.0000 ug | INTRAMUSCULAR | Status: DC | PRN

## 2017-08-02 MED ORDER — ALUM & MAG HYDROXIDE-SIMETH 200-200-20 MG/5ML PO SUSP: 30.0000 mL | ORAL | Status: DC | PRN

## 2017-08-02 MED ORDER — PROPOFOL 10 MG/ML IV BOLUS
INTRAVENOUS | Status: DC | PRN
  Administered 2017-08-02 (×2): 20 mg via INTRAVENOUS

## 2017-08-02 MED ORDER — BUPIVACAINE-EPINEPHRINE (PF) 0.25% -1:200000 IJ SOLN
INTRAMUSCULAR | Status: DC | PRN
  Administered 2017-08-02: 30 mL

## 2017-08-02 MED ORDER — METOCLOPRAMIDE HCL 5 MG/ML IJ SOLN: 5.0000 mg | Freq: Three times a day (TID) | INTRAMUSCULAR | Status: DC | PRN

## 2017-08-02 MED ORDER — TIMOLOL MALEATE 0.5 % OP SOLG
1.0000 [drp] | Freq: Every day | OPHTHALMIC | Status: DC
  Administered 2017-08-03 – 2017-08-05 (×3): 1 [drp] via OPHTHALMIC
  Filled 2017-08-02: qty 5

## 2017-08-02 MED ORDER — ONDANSETRON HCL 4 MG/2ML IJ SOLN: 4.0000 mg | Freq: Once | INTRAMUSCULAR | Status: DC | PRN

## 2017-08-02 MED ORDER — HYDROMORPHONE HCL 2 MG/ML IJ SOLN: 0.5000 mg | INTRAMUSCULAR | Status: DC | PRN

## 2017-08-02 MED ORDER — INSULIN ASPART 100 UNIT/ML ~~LOC~~ SOLN
0.0000 [IU] | Freq: Three times a day (TID) | SUBCUTANEOUS | Status: DC
  Administered 2017-08-02: 5 [IU] via SUBCUTANEOUS
  Administered 2017-08-03 (×2): 3 [IU] via SUBCUTANEOUS
  Administered 2017-08-03: 2 [IU] via SUBCUTANEOUS
  Administered 2017-08-04: 3 [IU] via SUBCUTANEOUS
  Administered 2017-08-04: 2 [IU] via SUBCUTANEOUS

## 2017-08-02 SURGICAL SUPPLY — 49 items
BANDAGE ESMARK 6X9 LF (GAUZE/BANDAGES/DRESSINGS) ×1 IMPLANT
BLADE SAG 18X100X1.27 (BLADE) ×2 IMPLANT
BLADE SAGITTAL 13X1.27X60 (BLADE) IMPLANT
BLADE SAW SGTL 13X75X1.27 (BLADE) ×2 IMPLANT
BNDG ELASTIC 6X10 VLCR STRL LF (GAUZE/BANDAGES/DRESSINGS) ×2 IMPLANT
BNDG ESMARK 6X9 LF (GAUZE/BANDAGES/DRESSINGS) ×2
BOWL SMART MIX CTS (DISPOSABLE) ×2 IMPLANT
CAPT KNEE TOTAL 3 ATTUNE ×2 IMPLANT
CEMENT HV SMART SET (Cement) ×4 IMPLANT
COVER SURGICAL LIGHT HANDLE (MISCELLANEOUS) ×2 IMPLANT
CUFF TOURNIQUET SINGLE 34IN LL (TOURNIQUET CUFF) ×2 IMPLANT
CUFF TOURNIQUET SINGLE 44IN (TOURNIQUET CUFF) IMPLANT
DRAPE EXTREMITY T 121X128X90 (DRAPE) ×2 IMPLANT
DRAPE INCISE IOBAN 66X45 STRL (DRAPES) ×2 IMPLANT
DRAPE U-SHAPE 47X51 STRL (DRAPES) ×2 IMPLANT
DRSG AQUACEL AG ADV 3.5X10 (GAUZE/BANDAGES/DRESSINGS) ×2 IMPLANT
DURAPREP 26ML APPLICATOR (WOUND CARE) ×2 IMPLANT
ELECT REM PT RETURN 9FT ADLT (ELECTROSURGICAL) ×2
ELECTRODE REM PT RTRN 9FT ADLT (ELECTROSURGICAL) ×1 IMPLANT
GLOVE BIO SURGEON STRL SZ7.5 (GLOVE) ×2 IMPLANT
GLOVE BIO SURGEON STRL SZ8.5 (GLOVE) ×2 IMPLANT
GLOVE BIOGEL PI IND STRL 8 (GLOVE) ×1 IMPLANT
GLOVE BIOGEL PI IND STRL 9 (GLOVE) ×1 IMPLANT
GLOVE BIOGEL PI INDICATOR 8 (GLOVE) ×1
GLOVE BIOGEL PI INDICATOR 9 (GLOVE) ×1
GOWN STRL REUS W/ TWL LRG LVL3 (GOWN DISPOSABLE) ×1 IMPLANT
GOWN STRL REUS W/ TWL XL LVL3 (GOWN DISPOSABLE) ×2 IMPLANT
GOWN STRL REUS W/TWL LRG LVL3 (GOWN DISPOSABLE) ×1
GOWN STRL REUS W/TWL XL LVL3 (GOWN DISPOSABLE) ×2
HANDPIECE INTERPULSE COAX TIP (DISPOSABLE) ×1
HOOD PEEL AWAY FACE SHEILD DIS (HOOD) ×4 IMPLANT
KIT BASIN OR (CUSTOM PROCEDURE TRAY) ×2 IMPLANT
KIT TURNOVER KIT B (KITS) ×2 IMPLANT
MANIFOLD NEPTUNE II (INSTRUMENTS) ×2 IMPLANT
NEEDLE 22X1 1/2 (OR ONLY) (NEEDLE) ×4 IMPLANT
NS IRRIG 1000ML POUR BTL (IV SOLUTION) ×2 IMPLANT
PACK TOTAL JOINT (CUSTOM PROCEDURE TRAY) ×2 IMPLANT
PAD ARMBOARD 7.5X6 YLW CONV (MISCELLANEOUS) ×4 IMPLANT
SET HNDPC FAN SPRY TIP SCT (DISPOSABLE) ×1 IMPLANT
SUT VIC AB 1 CTX 36 (SUTURE) ×1
SUT VIC AB 1 CTX36XBRD ANBCTR (SUTURE) ×1 IMPLANT
SUT VIC AB 2-0 CT1 27 (SUTURE) ×1
SUT VIC AB 2-0 CT1 TAPERPNT 27 (SUTURE) ×1 IMPLANT
SUT VIC AB 3-0 CT1 27 (SUTURE) ×1
SUT VIC AB 3-0 CT1 TAPERPNT 27 (SUTURE) ×1 IMPLANT
SYR CONTROL 10ML LL (SYRINGE) ×4 IMPLANT
TOWEL OR 17X24 6PK STRL BLUE (TOWEL DISPOSABLE) ×2 IMPLANT
TOWEL OR 17X26 10 PK STRL BLUE (TOWEL DISPOSABLE) ×2 IMPLANT
TRAY CATH 16FR W/PLASTIC CATH (SET/KITS/TRAYS/PACK) IMPLANT

## 2017-08-02 NOTE — Evaluation (Signed)
Physical Therapy Evaluation Patient Details Name: Vanessa Hamilton MRN: [id number] DOB: 1940-07-05 Today's Date: 08/02/2017   History of Present Illness  Pt is a 77 y/o female s/p elective R TKA. PMH includes HTN and DM.   Clinical Impression  Pt is s/p surgery above with deficits below. Pt requiring min to mod A for basic transfer to chair this session. Also reports numbness from spinal block in RLE, so mobility limited to chair. Educated about knee precautions and exercise program. Pt reports she will be staying with her daughter at d/c. Will continue to follow acutely to maximize functional mobility independence and safety.     Follow Up Recommendations Follow surgeon's recommendation for DC plan and follow-up therapies;Supervision for mobility/OOB    Equipment Recommendations  Rolling walker with 5" wheels;3in1 (PT)    Recommendations for Other Services OT consult     Precautions / Restrictions Precautions Precautions: Knee Precaution Booklet Issued: Yes (comment) Precaution Comments: Reviewed knee precautions with pt.  Restrictions Weight Bearing Restrictions: Yes RLE Weight Bearing: Weight bearing as tolerated      Mobility  Bed Mobility Overal bed mobility: Needs Assistance Bed Mobility: Supine to Sit     Supine to sit: Min assist     General bed mobility comments: Min A for RLE assist and trunk elevation. Increased time required to perform bed mobility.   Transfers Overall transfer level: Needs assistance Equipment used: Rolling walker (2 wheeled) Transfers: Sit to/from [employer] Sit to Stand: Mod assist;From elevated surface Stand pivot transfers: Min assist       General transfer comment: Mod A for lift assist and steadying. Verbal cues for safe hand placement. Min A throughout transfer to chair for steadying assist. Required verbal cues for sequencing with RW.   Ambulation/Gait                Stairs            Wheelchair  Mobility    Modified Rankin (Stroke Patients Only)       Balance Overall balance assessment: Needs assistance Sitting-balance support: No upper extremity supported;Feet supported Sitting balance-Leahy Scale: Good     Standing balance support: Bilateral upper extremity supported Standing balance-Leahy Scale: Poor Standing balance comment: Reliant on BE support                              Pertinent Vitals/Pain Pain Assessment: Faces Faces Pain Scale: Hurts little more Pain Location: R knee  Pain Descriptors / Indicators: Aching;Operative site guarding Pain Intervention(s): Limited activity within patient's tolerance;Monitored during session;Repositioned    Home Living Family/patient expects to be discharged to:: Private residence Living Arrangements: Alone Available Help at Discharge: Family;Available 24 hours/day(reports she will be staying at her daughter's ) Type of Home: House Home Access: Level entry     Home Layout: Two level Home Equipment: Cane - single point      Prior Function Level of Independence: Independent with assistive device(s)         Comments: Used cane for ambulation      Hand Dominance        Extremity/Trunk Assessment   Upper Extremity Assessment Upper Extremity Assessment: Defer to OT evaluation    Lower Extremity Assessment Lower Extremity Assessment: RLE deficits/detail RLE Deficits / Details: Reports some decreased sensation. Deficits consistent with post op pain and weakness.     Cervical / Trunk Assessment Cervical / Trunk Assessment: Normal  Communication  Communication: No difficulties  Cognition Arousal/Alertness: Awake/alert Behavior During Therapy: WFL for tasks assessed/performed Overall Cognitive Status: Within Functional Limits for tasks assessed                                        General Comments      Exercises Total Joint Exercises Ankle Circles/Pumps: AROM;Both;10 reps    Assessment/Plan    PT Assessment Patient needs continued PT services  PT Problem List Decreased strength;Decreased mobility;Decreased balance;Decreased range of motion;Decreased knowledge of use of DME;Decreased knowledge of precautions;Pain       PT Treatment Interventions DME instruction;Gait training;Stair training;Functional mobility training;Therapeutic activities;Balance training;Therapeutic exercise;Patient/family education    PT Goals (Current goals can be found in the Care Plan section)  Acute Rehab PT Goals Patient Stated Goal: to go home  PT Goal Formulation: With patient Time For Goal Achievement: 08/16/17 Potential to Achieve Goals: Good    Frequency 7X/week   Barriers to discharge        Co-evaluation               AM-PAC PT "6 Clicks" Daily Activity  Outcome Measure Difficulty turning over in bed (including adjusting bedclothes, sheets and blankets)?: A Little Difficulty moving from lying on back to sitting on the side of the bed? : Unable Difficulty sitting down on and standing up from a chair with arms (e.g., wheelchair, bedside commode, etc,.)?: Unable Help needed moving to and from a bed to chair (including a wheelchair)?: A Little Help needed walking in hospital room?: A Little Help needed climbing 3-5 steps with a railing? : A Lot 6 Click Score: 13    End of Session Equipment Utilized During Treatment: Gait belt Activity Tolerance: Patient tolerated treatment well Patient left: in chair;with call bell/phone within reach Nurse Communication: Mobility status PT Visit Diagnosis: Other abnormalities of gait and mobility (R26.89);Unsteadiness on feet (R26.81);Pain Pain - Right/Left: Right Pain - part of body: Knee    Time: [id number] PT Time Calculation (min) (ACUTE ONLY): 24 min   Charges:   PT Evaluation $PT Eval Low Complexity: 1 Low PT Treatments $Therapeutic Activity: 8-22 mins   PT G Codes:        Gladys DammeBrittany Mikell Kazlauskas, PT, DPT   Acute Rehabilitation Services  Pager: [phone]   Lehman PromBrittany S Amr Sturtevant 08/02/2017, 5:38 PM

## 2017-08-02 NOTE — Progress Notes (Signed)
Orthopedic Tech Progress Note Patient Details:  Mauro KaufmannJean Reifsteck 03/22/1940 [id number]  Ortho Devices Type of Ortho Device: Bone foam zero knee Ortho Device/Splint Location: rle Ortho Device/Splint Interventions: Application   Post Interventions Patient Tolerated: Well Instructions Provided: Care of device   Nikki DomCrawford, Koi Yarbro 08/02/2017, 11:16 AM

## 2017-08-02 NOTE — Anesthesia Procedure Notes (Signed)
Anesthesia Regional Block: Adductor canal block   Pre-Anesthetic Checklist: ,, timeout performed, Correct Patient, Correct Site, Correct Laterality, Correct Procedure, Correct Position, site marked, Risks and benefits discussed, pre-op evaluation,  At surgeon's request and post-op pain management  Laterality: Right  Prep: Maximum Sterile Barrier Precautions used, chloraprep       Needles:  Injection technique: Single-shot  Needle Type: Echogenic Stimulator Needle     Needle Length: 9cm  Needle Gauge: 21     Additional Needles:   Procedures:,,,, ultrasound used (permanent image in chart),,,,  Narrative:  Start time: 08/02/2017 8:10 AM End time: 08/02/2017 8:20 AM Injection made incrementally with aspirations every 5 mL.  Performed by: Personally  Anesthesiologist: Kipp BroodJoslin, Raybon Conard, MD  Additional Notes: 20 cc 0.75% Ropivacaine with 03 mg clonidine injected easily

## 2017-08-02 NOTE — Anesthesia Postprocedure Evaluation (Signed)
Anesthesia Post Note  Patient: Vanessa Hamilton  Procedure(s) Performed: RIGHT TOTAL KNEE ARTHROPLASTY (Right Knee)     Patient location during evaluation: PACU Anesthesia Type: MAC Level of consciousness: oriented and awake and alert Pain management: pain level controlled Vital Signs Assessment: post-procedure vital signs reviewed and stable Respiratory status: spontaneous breathing, respiratory function stable and patient connected to nasal cannula oxygen Cardiovascular status: blood pressure returned to baseline and stable Postop Assessment: no headache, no backache and no apparent nausea or vomiting Anesthetic complications: no    Last Vitals:  Vitals:   08/02/17 1140 08/02/17 1409  BP: 105/75 131/77  Pulse: (!) 47 70  Resp: 14 18  Temp: (!) 36.3 C (!) 36.4 C  SpO2: 93% 99%    Last Pain:  Vitals:   08/02/17 1409  TempSrc: Oral  PainSc:                  Vanessa Hamilton

## 2017-08-02 NOTE — Interval H&P Note (Signed)
History and Physical Interval Note:  08/02/2017 8:46 AM  Vanessa KaufmannJean Hamilton  has presented today for surgery, with the diagnosis of RIGHT KNEE OSTEOARTHRITIS  The various methods of treatment have been discussed with the patient and family. After consideration of risks, benefits and other options for treatment, the patient has consented to  Procedure(s): RIGHT TOTAL KNEE ARTHROPLASTY (Right) as a surgical intervention .  The patient's history has been reviewed, patient examined, no change in status, stable for surgery.  I have reviewed the patient's chart and labs.  Questions were answered to the patient's satisfaction.     Nestor LewandowskyFrank J Mesa Janus

## 2017-08-02 NOTE — Transfer of Care (Signed)
Immediate Anesthesia Transfer of Care Note  Patient: Vanessa Hamilton  Procedure(s) Performed: RIGHT TOTAL KNEE ARTHROPLASTY (Right Knee)  Patient Location: PACU  Anesthesia Type:MAC and MAC combined with regional for post-op pain  Level of Consciousness: drowsy and patient cooperative  Airway & Oxygen Therapy: Patient Spontanous Breathing and Patient connected to face mask oxygen  Post-op Assessment: Report given to RN and Post -op Vital signs reviewed and stable  Post vital signs: Reviewed and stable  Last Vitals:  Vitals Value Taken Time  BP 98/61 08/02/2017 10:46 AM  Temp    Pulse 57 08/02/2017 10:48 AM  Resp 13 08/02/2017 10:48 AM  SpO2 98 % 08/02/2017 10:48 AM  Vitals shown include unvalidated device data.  Last Pain:  Vitals:   08/02/17 0830  TempSrc:   PainSc: 0-No pain         Complications: No apparent anesthesia complications

## 2017-08-02 NOTE — OR Nursing (Signed)
1035: In&out cath=550cc cyu, per protocol.

## 2017-08-02 NOTE — Op Note (Signed)
PATIENT ID:      Vanessa Hamilton  MRN:     [id number] DOB/AGE:    December 12, 1940 / 77 y.o.       OPERATIVE REPORT    DATE OF PROCEDURE:  08/02/2017       PREOPERATIVE DIAGNOSIS:   RIGHT KNEE OSTEOARTHRITIS      Estimated body mass index is 32.77 kg/m as calculated from the following:   Height as of this encounter:  (1.676 m).   Weight as of this encounter: 203 lb (92.1 kg).                                                        POSTOPERATIVE DIAGNOSIS:   RIGHT KNEE OSTEOARTHRITIS                                                                      PROCEDURE:  Procedure(s): RIGHT TOTAL KNEE ARTHROPLASTY Using DepuyAttune RP implants #5R Femur, #6Tibia, 5 mm Attune RP bearing, 41 Patella     SURGEON: Nestor Lewandowsky    ASSISTANT:   Tomi Likens. [employer]   (Present and scrubbed throughout the case, critical for assistance with exposure, retraction, instrumentation, and closure.)         ANESTHESIA: Spinal, 20cc Exparel, 50cc 0.25% Marcaine  EBL: 250cc  FLUID REPLACEMENT: 1600 crystalloid  TOURNIQUET TIME:  Drains: None  Tranexamic Acid: 1gm IV, 2gm topical  COMPLICATIONS:  None         INDICATIONS FOR PROCEDURE: The patient has  RIGHT KNEE OSTEOARTHRITIS, Val deformities, XR shows bone on bone arthritis, lateral subluxation of tibia. Patient has failed all conservative measures including anti-inflammatory medicines, narcotics, attempts at  exercise and weight loss, cortisone injections and viscosupplementation.  Risks and benefits of surgery have been discussed, questions answered.   DESCRIPTION OF PROCEDURE: The patient identified by armband, received  IV antibiotics, in the holding area at [facility]. Patient taken to the operating room, appropriate anesthetic  monitors were attached, and Spinal anesthesia was  induced. Tourniquet  applied high to the operative thigh. Lateral post and foot positioner  applied to the table, the lower extremity was then prepped and draped  in  usual sterile fashion from the toes to the tourniquet. Time-out procedure was performed. We began the operation, with the knee flexed 120 degrees, by making the anterior midline incision starting at handbreadth above the patella going over the patella 1 cm medial to and 4 cm distal to the tibial tubercle. Small bleeders in the skin and the  subcutaneous tissue identified and cauterized. Transverse retinaculum was incised and reflected medially and a medial parapatellar arthrotomy was accomplished. the patella was everted and theprepatellar fat pad resected. The superficial medial collateral  ligament was then elevated from anterior to posterior along the proximal  flare of the tibia and anterior half of the menisci resected. The knee was hyperflexed exposing bone on bone arthritis. Peripheral and notch osteophytes as well as the cruciate ligaments were then resected. We continued to  work our way around posteriorly along the proximal  tibia, and externally  rotated the tibia subluxing it out from underneath the femur. A McHale  retractor was placed through the notch and a lateral Hohmann retractor  placed, and we then drilled through the proximal tibia in line with the  axis of the tibia followed by an intramedullary guide rod and 2-degree  posterior slope cutting guide. The tibial cutting guide, 3 degree posterior sloped, was pinned into place allowing resection of 7 mm of bone medially and 0 mm of bone laterally. Satisfied with the tibial resection, we then  entered the distal femur 2 mm anterior to the PCL origin with the  intramedullary guide rod and applied the distal femoral cutting guide  set at 9 mm, with 6 degrees of valgus. This was pinned along the  epicondylar axis. At this point, the distal femoral cut was accomplished without difficulty. We then sized for a #5R femoral component and pinned the guide in 3 degrees of external rotation. The chamfer cutting guide was pinned into place. The  anterior, posterior, and chamfer cuts were accomplished without difficulty followed by  the Attune RP box cutting guide and the box cut. We also removed posterior osteophytes from the posterior femoral condyles. At this  time, the knee was brought into full extension. We checked our  extension and flexion gaps and found them symmetric for a 5 mm bearing. Distracting in extension with a lamina spreader, the posterior horns of the menisci were removed, and Exparel, diluted to 60 cc, with 20cc NS, and 20cc 0.5% Marcaine,was injected into the capsule and synovium of the knee. The posterior patella cut was accomplished with the 9.5 mm Attune cutting guide, sized for a 41mm dome, and the fixation pegs drilled.The knee  was then once again hyperflexed exposing the proximal tibia. We sized for a # 6 tibial base plate, applied the smokestack and the conical reamer followed by the the Delta fin keel punch. We then hammered into place the Attune RP trial femoral component, drilled the lugs, inserted a  5 mm trial bearing, trial patellar button, and took the knee through range of motion from 0-130 degrees. No thumb pressure was required for patellar Tracking. At this point, the limb was wrapped with an Esmarch bandage and the tourniquet inflated to 350 mmHg. All trial components were removed, mating surfaces irrigated with pulse lavage, and dried with suction and sponges. 10 cc of the Exparel solution was applied to the cancellus bone of the patella distal femur and proximal tibia.  After waiting 1 minute, the bony surfaces were again, dried with sponges. A double batch of DePuy HV cement with 1500 mg of Zinacef was mixed and applied to all bony metallic mating surfaces except for the posterior condyles of the femur itself. In order, we hammered into place the tibial tray and removed excess cement, the femoral component and removed excess cement. The final Attune RP bearing  was inserted, and the knee brought to full  extension with compression.  The patellar button was clamped into place, and excess cement  removed. While the cement cured the wound was irrigated out with normal saline solution pulse lavage. Ligament stability and patellar tracking were checked and found to be excellent. The parapatellar arthrotomy was closed with  running #1 Vicryl suture. The subcutaneous tissue with 0 and 2-0 undyed  Vicryl suture, and the skin with running 3-0 SQ vicryl. A dressing of Xeroform,  4 x 4, dressing sponges, Webril, and Ace wrap applied. The patient  awakened, and  taken to recovery room without difficulty.   Nestor Lewandowsky 08/02/2017, 10:26 AM

## 2017-08-02 NOTE — Anesthesia Preprocedure Evaluation (Signed)
Anesthesia Evaluation  Patient identified by MRN, date of birth, ID band Patient awake    Reviewed: Allergy & Precautions, NPO status , Patient's Chart, lab work & pertinent test results  Airway Mallampati: II  TM Distance: >3 FB Neck ROM: Full    Dental  (+) Teeth Intact, Dental Advisory Given   Pulmonary    breath sounds clear to auscultation       Cardiovascular hypertension,  Rhythm:Regular     Neuro/Psych    GI/Hepatic   Endo/Other  diabetes  Renal/GU      Musculoskeletal   Abdominal   Peds  Hematology   Anesthesia Other Findings   Reproductive/Obstetrics                             Anesthesia Physical Anesthesia Plan  ASA: III  Anesthesia Plan: MAC and Spinal   Post-op Pain Management:    Induction: Intravenous  PONV Risk Score and Plan: Ondansetron and Propofol infusion  Airway Management Planned: Natural Airway and Simple Face Mask  Additional Equipment:   Intra-op Plan:   Post-operative Plan:   Informed Consent: I have reviewed the patients History and Physical, chart, labs and discussed the procedure including the risks, benefits and alternatives for the proposed anesthesia with the patient or authorized representative who has indicated his/her understanding and acceptance.   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

## 2017-08-02 NOTE — Discharge Instructions (Signed)

## 2017-08-02 NOTE — Progress Notes (Signed)
No further episodes of hr 30's/ remains alert/orinted/ stable/ to floor

## 2017-08-02 NOTE — Progress Notes (Signed)
Spoke with dr Noreene Larssonjoslin / mda re episodic hr drop to 30's with no loss of p waves / stable , mentation intact, saw at bedside. Will monitor, felt related to spinal

## 2017-08-03 LAB — BASIC METABOLIC PANEL
Anion gap: 7 (ref 5–15)
BUN: 7 mg/dL (ref 6–20)
CO2: 25 mmol/L (ref 22–32)
Calcium: 8.9 mg/dL (ref 8.9–10.3)
Chloride: 104 mmol/L (ref 101–111)
Creatinine, Ser: 0.7 mg/dL (ref 0.44–1.00)
GFR calc Af Amer: >60 mL/min (ref >60)
GLUCOSE: 166 mg/dL — AB (ref 65–99)
POTASSIUM: 4.2 mmol/L (ref 3.5–5.1)
SODIUM: 136 mmol/L (ref 135–145)

## 2017-08-03 LAB — CBC
HCT: 34.3 % — ABNORMAL LOW (ref 36.0–46.0)
Hemoglobin: 11 g/dL — ABNORMAL LOW (ref 12.0–15.0)
MCH: 29.6 pg (ref 26.0–34.0)
MCHC: 32.1 g/dL (ref 30.0–36.0)
MCV: 92.5 fL (ref 78.0–100.0)
PLATELETS: 248 10*3/uL (ref 150–400)
RBC: 3.71 MIL/uL — AB (ref 3.87–5.11)
RDW: 13.2 % (ref 11.5–15.5)
WBC: 8 10*3/uL (ref 4.0–10.5)

## 2017-08-03 LAB — GLUCOSE, CAPILLARY
GLUCOSE-CAPILLARY: 162 mg/dL — AB (ref 65–99)
GLUCOSE-CAPILLARY: 155 mg/dL — AB (ref 65–99)
Glucose-Capillary: 147 mg/dL — ABNORMAL HIGH (ref 65–99)
Glucose-Capillary: 168 mg/dL — ABNORMAL HIGH (ref 65–99)

## 2017-08-03 NOTE — Progress Notes (Signed)
Physical Therapy Treatment Patient Details Name: Vanessa Hamilton MRN: [id number] DOB: October 11, 1940 Today's Date: 08/03/2017    History of Present Illness Pt is a 77 y/o female s/p elective R TKA. PMH includes HTN and DM.     PT Comments    Pt continues to demonstrate deficits in balance, strength, mobility, and safety. Pt shows improvement from this AM session as she was able to tolerate ambulation with mod A and chair to follow for safety. Pt progressed to min A with gait. Depending on pt's progression, feel pt may benefit from short term stay in SNF prior to return home to maximize pt's safety with mobility and functional independence.    Follow Up Recommendations  Follow surgeon's recommendation for DC plan and follow-up therapies;Supervision for mobility/OOB     Equipment Recommendations  Rolling walker with 5" wheels;3in1 (PT)    Recommendations for Other Services OT consult     Precautions / Restrictions Precautions Precautions: Knee Precaution Booklet Issued: Yes (comment) Precaution Comments: Reviewed knee precautions with pt.  Restrictions Weight Bearing Restrictions: Yes RLE Weight Bearing: Weight bearing as tolerated    Mobility  Bed Mobility               General bed mobility comments: in chair on arrival  Transfers Overall transfer level: Needs assistance Equipment used: Rolling walker (2 wheeled) Transfers: Sit to/from Stand Sit to Stand: Mod assist         General transfer comment: Sit<>stand performed 2x. Pt requiring increased time to rise into standing and stabalize once in standing. Cues for technique and hand placement  Ambulation/Gait Ambulation/Gait assistance: Min assist;Mod assist;+2 safety/equipment(chair to follow) Gait Distance (Feet): 40 Feet Assistive device: Rolling walker (2 wheeled) Gait Pattern/deviations: Step-to pattern;Decreased step length - left;Decreased stance time - right;Decreased stride length;Decreased weight shift to  right;Antalgic;Trunk flexed Gait velocity: decreased Gait velocity interpretation: <1.31 ft/sec, indicative of household ambulator General Gait Details: Pt initially requiring mod A to ambulate. Assist required to steady and weight shift to R LE. She progressed to min A to steady with cues for increased step length on L and postural control. Pt with significantly decreased gait speed.   Stairs             Wheelchair Mobility    Modified Rankin (Stroke Patients Only)       Balance Overall balance assessment: Needs assistance Sitting-balance support: No upper extremity supported;Feet supported Sitting balance-Leahy Scale: Poor Sitting balance - Comments: posterior lean, requiring cues to correct Postural control: Posterior lean Standing balance support: Bilateral upper extremity supported Standing balance-Leahy Scale: Poor Standing balance comment: Reliant on bil UE support and external assist for balance                            Cognition Arousal/Alertness: Lethargic;Suspect due to medications Behavior During Therapy: Flat affect Overall Cognitive Status: No family/caregiver present to determine baseline cognitive functioning                                 General Comments: Pt slightly more alert this PM, though still lethargic.      Exercises Total Joint Exercises Short Arc Quad: AROM;Right;10 reps;Seated Hip ABduction/ADduction: AROM;Right;10 reps;Seated Straight Leg Raises: AROM;Right;10 reps;Seated Marching in Standing: AROM;Both;10 reps;Standing(for pregait activities)    General Comments        Pertinent Vitals/Pain Pain Assessment: Faces Faces Pain Scale: Hurts little  more Pain Location: R knee  Pain Descriptors / Indicators: Aching;Operative site guarding Pain Intervention(s): Monitored during session;Limited activity within patient's tolerance    Home Living                      Prior Function            PT  Goals (current goals can now be found in the care plan section) Acute Rehab PT Goals Patient Stated Goal: to go home  PT Goal Formulation: With patient Time For Goal Achievement: 08/16/17 Potential to Achieve Goals: Good Progress towards PT goals: Progressing toward goals    Frequency    7X/week      PT Plan Current plan remains appropriate    Co-evaluation              AM-PAC PT "6 Clicks" Daily Activity  Outcome Measure  Difficulty turning over in bed (including adjusting bedclothes, sheets and blankets)?: Unable Difficulty moving from lying on back to sitting on the side of the bed? : Unable Difficulty sitting down on and standing up from a chair with arms (e.g., wheelchair, bedside commode, etc,.)?: Unable Help needed moving to and from a bed to chair (including a wheelchair)?: A Lot Help needed walking in hospital room?: A Lot Help needed climbing 3-5 steps with a railing? : Total 6 Click Score: 8    End of Session Equipment Utilized During Treatment: Gait belt Activity Tolerance: Patient tolerated treatment well Patient left: in chair;with call bell/phone within reach Nurse Communication: Mobility status PT Visit Diagnosis: Other abnormalities of gait and mobility (R26.89);Unsteadiness on feet (R26.81);Pain Pain - Right/Left: Right Pain - part of body: Knee     Time: 1350-1429 PT Time Calculation (min) (ACUTE ONLY): 39 min  Charges:  $Gait Training: 8-22 mins $Therapeutic Exercise: 8-22 mins $Therapeutic Activity: 8-22 mins                    G Codes:      Kallie LocksHannah Calum Cormier, [place]PTA Pager [phone] Acute Rehab   Sheral ApleyHannah E Andrue Dini 08/03/2017, 2:44 PM

## 2017-08-03 NOTE — Progress Notes (Signed)
Physical Therapy Treatment Patient Details Name: Vanessa KaufmannJean Hamilton MRN: [id number] DOB: 12/07/1940 Today's Date: 08/03/2017    History of Present Illness Pt is a 77 y/o female s/p elective R TKA. PMH includes HTN and DM.     PT Comments    This morning's treatment limited by pt lethargy. Pt asleep on arrival to room and eyes closed throughout session despite cues to open eyes. Pt with significant posterior lean in sitting and standing, requiring mod A to steady in standing. Performed pregait activities EOB and stand pivot transfer to recliner chair with mod A. Seated HEP performed in recliner with frequent VC as pt was nodding off. RN notified of pt's lethargy. Feel pt's current balance deficits put her at a high risk for falls. Will continue to follow acutely to progress activity tolerance and safety with mobility.    Follow Up Recommendations  Follow surgeon's recommendation for DC plan and follow-up therapies;Supervision for mobility/OOB     Equipment Recommendations  Rolling walker with 5" wheels;3in1 (PT)    Recommendations for Other Services OT consult     Precautions / Restrictions Precautions Precautions: Knee Precaution Booklet Issued: Yes (comment) Precaution Comments: Reviewed knee precautions with pt.  Restrictions Weight Bearing Restrictions: Yes RLE Weight Bearing: Weight bearing as tolerated    Mobility  Bed Mobility Overal bed mobility: Needs Assistance Bed Mobility: Supine to Sit     Supine to sit: Min assist     General bed mobility comments: Min A for RLE assist and trunk elevation. Increased time required to perform bed mobility.   Transfers Overall transfer level: Needs assistance Equipment used: Rolling walker (2 wheeled) Transfers: Sit to/from UGI CorporationStand;Stand Pivot Transfers Sit to Stand: Mod assist Stand pivot transfers: Mod assist       General transfer comment: Sit <> stand x2. Mod A on each attempt with manual facilitation for forward weight shift,  assist to power up and steady throughout transfer. Pt required mod A for pivot to chair to steady and for RW management  Ambulation/Gait             General Gait Details: Pregait activities performed EOB. Lateral and A&P weight shifts performed with mod A to steady.   Stairs             Wheelchair Mobility    Modified Rankin (Stroke Patients Only)       Balance Overall balance assessment: Needs assistance Sitting-balance support: No upper extremity supported;Feet supported Sitting balance-Leahy Scale: Poor Sitting balance - Comments: pt with posterior lean at EOB requiring intermittant assist to correct Postural control: Posterior lean Standing balance support: Bilateral upper extremity supported Standing balance-Leahy Scale: Poor Standing balance comment: Reliant on bil UE support and mod A to stand statically                            Cognition Arousal/Alertness: Lethargic;Suspect due to medications Behavior During Therapy: Flat affect Overall Cognitive Status: No family/caregiver present to determine baseline cognitive functioning                                 General Comments: lethargic, with eyes closed most of session. Able to answer orientation questions with increased time.      Exercises Total Joint Exercises Heel Slides: AROM;Right;10 reps;Seated Long Arc Quad: AAROM;Right;10 reps;Seated Knee Flexion: AROM;Right;5 reps;Seated Marching in Standing: AROM;Both;10 reps;Standing(standing EOB with mod A to steady)  General Comments        Pertinent Vitals/Pain Pain Assessment: Faces Faces Pain Scale: Hurts little more Pain Location: R knee  Pain Descriptors / Indicators: Aching;Operative site guarding Pain Intervention(s): Monitored during session;Limited activity within patient's tolerance;Repositioned    Home Living                      Prior Function            PT Goals (current goals can now be  found in the care plan section) Acute Rehab PT Goals Patient Stated Goal: to go home  PT Goal Formulation: With patient Time For Goal Achievement: 08/16/17 Potential to Achieve Goals: Good Progress towards PT goals: Not progressing toward goals - comment(lethargy limiting participation)    Frequency    7X/week      PT Plan Current plan remains appropriate    Co-evaluation              AM-PAC PT "6 Clicks" Daily Activity  Outcome Measure  Difficulty turning over in bed (including adjusting bedclothes, sheets and blankets)?: Unable Difficulty moving from lying on back to sitting on the side of the bed? : Unable Difficulty sitting down on and standing up from a chair with arms (e.g., wheelchair, bedside commode, etc,.)?: Unable Help needed moving to and from a bed to chair (including a wheelchair)?: A Lot Help needed walking in hospital room?: A Lot Help needed climbing 3-5 steps with a railing? : A Lot 6 Click Score: 9    End of Session Equipment Utilized During Treatment: Gait belt Activity Tolerance: Patient limited by lethargy Patient left: in chair;with call bell/phone within reach Nurse Communication: Mobility status PT Visit Diagnosis: Other abnormalities of gait and mobility (R26.89);Unsteadiness on feet (R26.81);Pain Pain - Right/Left: Right Pain - part of body: Knee     Time: 6045-4098 PT Time Calculation (min) (ACUTE ONLY): 25 min  Charges:  $Therapeutic Exercise: 8-22 mins $Therapeutic Activity: 8-22 mins                    G Codes:       Kallie Locks, [place] Pager [phone] Acute Rehab   Sheral Apley 08/03/2017, 9:41 AM

## 2017-08-03 NOTE — Progress Notes (Signed)
Subjective: 1 Day Post-Op Procedure(s) (LRB): RIGHT TOTAL KNEE ARTHROPLASTY (Right)   Patient resting comfortably in bed. They had to in and out cath yesterday but since then patient has gone on her own 3 times.   Activity level:  wbat Diet tolerance:  ok Voiding:  ok Patient reports pain as mild and moderate.    Objective: Vital signs in last 24 hours: Temp:  [97.3 F (36.3 C)-98.7 F (37.1 C)] 98.7 F (37.1 C) (06/15 0348) Pulse Rate:  [47-71] 65 (06/15 0348) Resp:  [10-20] 13 (06/15 0348) BP: (98-161)/(54-77) 126/63 (06/15 0348) SpO2:  [93 %-100 %] 93 % (06/15 0348) Weight:  [92.1 kg (203 lb)] 92.1 kg (203 lb) (06/14 0745)  Labs: Recent Labs    08/03/17 0614  HGB 11.0*   Recent Labs    08/03/17 0614  WBC 8.0  RBC 3.71*  HCT 34.3*  PLT 248   No results for input(s): NA, K, CL, CO2, BUN, CREATININE, GLUCOSE, CALCIUM in the last 72 hours. No results for input(s): LABPT, INR in the last 72 hours.  Physical Exam:  Neurologically intact ABD soft Neurovascular intact Sensation intact distally Intact pulses distally Dorsiflexion/Plantar flexion intact Incision: dressing C/D/I and no drainage No cellulitis present Compartment soft  Assessment/Plan:  1 Day Post-Op Procedure(s) (LRB): RIGHT TOTAL KNEE ARTHROPLASTY (Right) Advance diet Up with therapy Plan for discharge tomorrow Discharge home with home health if doing well and cleared by PT. Continue on ASA for DVT prevention. Follow up in office with Dr. Turner Danielsowan 2 weeks post op. Anticipated LOS equal to or greater than 2 midnights due to - Age 78 and older with one or more of the following:  - Obesity  - Expected need for hospital services (PT, OT, Nursing) required for safe  discharge  - Anticipated need for postoperative skilled nursing care or inpatient rehab  - Active co-morbidities: Diabetes OR   - Unanticipated findings during/Post Surgery: Slow post-op progression: GI, pain control, mobility  -  Patient is a high risk of re-admission due to: None  Kazzandra Desaulniers, Ginger OrganNDREW PAUL 08/03/2017, 7:34 AM

## 2017-08-04 LAB — CBC
HEMATOCRIT: 36.2 % (ref 36.0–46.0)
Hemoglobin: 11.6 g/dL — ABNORMAL LOW (ref 12.0–15.0)
MCH: 29.7 pg (ref 26.0–34.0)
MCHC: 32 g/dL (ref 30.0–36.0)
MCV: 92.6 fL (ref 78.0–100.0)
Platelets: 224 10*3/uL (ref 150–400)
RBC: 3.91 MIL/uL (ref 3.87–5.11)
RDW: 13.3 % (ref 11.5–15.5)
WBC: 10.3 10*3/uL (ref 4.0–10.5)

## 2017-08-04 LAB — GLUCOSE, CAPILLARY
GLUCOSE-CAPILLARY: 153 mg/dL — AB (ref 65–99)
GLUCOSE-CAPILLARY: 149 mg/dL — AB (ref 65–99)
GLUCOSE-CAPILLARY: 152 mg/dL — AB (ref 65–99)
Glucose-Capillary: 112 mg/dL — ABNORMAL HIGH (ref 65–99)

## 2017-08-04 NOTE — Progress Notes (Signed)
OT Note - Addendum    08/04/17 1016  OT Visit Information  Last OT Received On 08/04/17  OT Time Calculation  OT Start Time (ACUTE ONLY) 0827  OT Stop Time (ACUTE ONLY) 0902  OT Time Calculation (min) 35 min  OT General Charges  $OT Visit 1 Visit  OT Evaluation  $OT Eval Low Complexity 1 Low  OT Treatments  $Self Care/Home Management  8-22 mins  Luisa DagoHilary Mat Stuard, OT/L  OT Clinical Specialist [phone]

## 2017-08-04 NOTE — Progress Notes (Signed)
Subjective: 2 Days Post-Op Procedure(s) (LRB): RIGHT TOTAL KNEE ARTHROPLASTY (Right)   Patient resting comfortably in bedside chair. Still quite tired and fatigued. PT is reccomending the possibility of SNF if she does not progress. SHe also had one elevated temperature of 102.7 last night but states she feels fine.  Activity level:  wbat Diet tolerance:  ok Voiding:  ok Patient reports pain as mild.    Objective: Vital signs in last 24 hours: Temp:  [98.5 F (36.9 C)-102.7 F (39.3 C)] 98.5 F (36.9 C) (06/16 0341) Pulse Rate:  [78-96] 84 (06/16 0341) Resp:  [15-20] 15 (06/16 0341) BP: (135-184)/(64-68) 135/65 (06/16 0341) SpO2:  [91 %-96 %] 96 % (06/16 0341)  Labs: Recent Labs    08/03/17 0614 08/04/17 0231  HGB 11.0* 11.6*   Recent Labs    08/03/17 0614 08/04/17 0231  WBC 8.0 10.3  RBC 3.71* 3.91  HCT 34.3* 36.2  PLT 248 224   Recent Labs    08/03/17 0614  NA 136  K 4.2  CL 104  CO2 25  BUN 7  CREATININE 0.70  GLUCOSE 166*  CALCIUM 8.9   No results for input(s): LABPT, INR in the last 72 hours.  Physical Exam:  Neurologically intact ABD soft Neurovascular intact Sensation intact distally Intact pulses distally Dorsiflexion/Plantar flexion intact Incision: dressing C/D/I and no drainage No cellulitis present Compartment soft  Assessment/Plan:  2 Days Post-Op Procedure(s) (LRB): RIGHT TOTAL KNEE ARTHROPLASTY (Right) Advance diet Up with therapy Plan for discharge tomorrow Discharge home with home health if cleared by PT and feeling better. If not may need a short stay at SNF. Continue on ASA for DVT prevention. Follow up in office 2 weeks post op. Patient is afebrile now and she feels fine. Her white count remains normal. I encouraged and educated on the use of incentive spirometer. Anticipated LOS equal to or greater than 2 midnights due to - Age 78 and older with one or more of the following:  - Obesity  - Expected need for hospital  services (PT, OT, Nursing) required for safe  discharge  - Anticipated need for postoperative skilled nursing care or inpatient rehab  - Active co-morbidities: Diabetes OR   - Unanticipated findings during/Post Surgery: Slow post-op progression: GI, pain control, mobility  - Patient is a high risk of re-admission due to: None  Vanessa Hamilton, Vanessa OrganNDREW Hamilton 08/04/2017, 9:04 AM

## 2017-08-04 NOTE — Progress Notes (Signed)
Occupational Therapy Evaluation Patient Details Name: Vanessa Hamilton MRN: [id number] DOB: [birth year] Today's Date: 08/04/2017    History of Present Illness Pt is a 77 y/o female s/p elective R TKA. PMH includes HTN and DM.    Clinical Impression   PTA, pt was living at home and was independent with ADLs and modified independent with functional mobility with intermittent use of cane. Pt currently requires modA for LB dressing, toilet transfers and functional mobility at RW level. Depending on pt's progress, pt may benefit from short term stay in SNF to facilitate safe return home. If pt does not d/c to SNF she would benefit from [facility] to maximize independence and safety with functional mobility and ADLs. Will continue to follow acutely to monitor pt's progress.      Follow Up Recommendations  Follow surgeon's recommendation for DC plan and follow-up therapies;Supervision/Assistance - 24 hour    Equipment Recommendations  None recommended by OT    Recommendations for Other Services PT consult     Precautions / Restrictions Precautions Precautions: Knee Restrictions Weight Bearing Restrictions: Yes RLE Weight Bearing: Weight bearing as tolerated      Mobility Bed Mobility Overal bed mobility: Needs Assistance Bed Mobility: Supine to Sit     Supine to sit: Mod assist;HOB elevated        Transfers Overall transfer level: Needs assistance Equipment used: Rolling walker (2 wheeled) Transfers: Sit to/from Stand Sit to Stand: Mod assist              Balance Overall balance assessment: Needs assistance Sitting-balance support: No upper extremity supported;Feet supported Sitting balance-Leahy Scale: Fair     Standing balance support: Bilateral upper extremity supported;During functional activity Standing balance-Leahy Scale: Fair Standing balance comment: Reliant on BUE support and external assist for balance                           ADL either performed or  assessed with clinical judgement   ADL Overall ADL's : Needs assistance/impaired Eating/Feeding: Set up   Grooming: Minimal assistance;Standing   Upper Body Bathing: Set up;Sitting   Lower Body Bathing: Moderate assistance;Sit to/from stand   Upper Body Dressing : Set up;Sitting   Lower Body Dressing: Moderate assistance;Sit to/from stand   Toilet Transfer: Moderate assistance Toilet Transfer Details (indicate cue type and reason): pt ambulated from bed to toilet with modA for sit<>stand and minA during ambulation for sequencing of steps/safe hand placement/and to kick out RLE to descend  Toileting- Clothing Manipulation and Hygiene: Minimal assistance;Moderate assistance Toileting - Clothing Manipulation Details (indicate cue type and reason): modA for sit<>stand;minA for clothing management     Functional mobility during ADLs: Moderate assistance;Cueing for safety;Cueing for sequencing;Rolling walker General ADL Comments: pt reported incontinence last night due to inability to get ot toilet in time;     Vision         Perception     Praxis      Pertinent Vitals/Pain Pain Assessment: Faces Faces Pain Scale: Hurts even more Pain Location: R knee;sharp with sit<>stand Pain Descriptors / Indicators: Grimacing;Operative site guarding;Sharp Pain Intervention(s): Limited activity within patient's tolerance;Monitored during session     Hand Dominance     Extremity/Trunk Assessment Upper Extremity Assessment Upper Extremity Assessment: Overall WFL for tasks assessed   Lower Extremity Assessment Lower Extremity Assessment: RLE deficits/detail RLE Deficits / Details: deficits consistent with post-op pain and weakness   Cervical / Trunk Assessment Cervical / Trunk Assessment: Normal  Communication Communication Communication: No difficulties   Cognition Arousal/Alertness: Lethargic;Suspect due to medications Behavior During Therapy: Flat affect Overall Cognitive  Status: No family/caregiver present to determine baseline cognitive functioning                                 General Comments: pt aware of deficits/decline in functioning;verbalized she "does not feel strong enough to go home today"   General Comments       Exercises Total Joint Exercises Marching in Standing: (for pregait activities)   Shoulder Instructions      Home Living Family/patient expects to be discharged to:: Private residence Living Arrangements: Alone Available Help at Discharge: Family;Available 24 hours/day(reports she will be staying at her daughter's ) Type of Home: House Home Access: Level entry     Home Layout: Two level Alternate Level Stairs-Number of Steps: flight  Alternate Level Stairs-Rails: Right Bathroom Shower/Tub: Chief Strategy OfficerTub/shower unit   Bathroom Toilet: Standard     Home Equipment: Cane - single point          Prior Functioning/Environment Level of Independence: Independent with assistive device(s)        Comments: Used cane for ambulation         OT Problem List: Decreased strength;Decreased range of motion;Decreased activity tolerance;Impaired balance (sitting and/or standing);Decreased coordination;Decreased safety awareness;Pain      OT Treatment/Interventions: Self-care/ADL training;Therapeutic exercise;Energy conservation;DME and/or AE instruction;Therapeutic activities;Patient/family education;Balance training    OT Goals(Current goals can be found in the care plan section) Acute Rehab OT Goals Patient Stated Goal: to go home OT Goal Formulation: With patient Time For Goal Achievement: 08/18/17 Potential to Achieve Goals: Good  OT Frequency: Min 2X/week   Barriers to D/C:            Co-evaluation              AM-PAC PT "6 Clicks" Daily Activity     Outcome Measure Help from another person eating meals?: None Help from another person taking care of personal grooming?: A Little Help from another  person toileting, which includes using toliet, bedpan, or urinal?: A Little Help from another person bathing (including washing, rinsing, drying)?: A Little Help from another person to put on and taking off regular upper body clothing?: A Little Help from another person to put on and taking off regular lower body clothing?: A Little 6 Click Score: 19   End of Session Equipment Utilized During Treatment: Gait belt Nurse Communication: Mobility status;Other (comment)(removed periwick)  Activity Tolerance: Patient tolerated treatment well Patient left: in chair;with call bell/phone within reach;Other (comment)(with ice on knee)  OT Visit Diagnosis: Unsteadiness on feet (R26.81);Other abnormalities of gait and mobility (R26.89);Pain Pain - Right/Left: Right Pain - part of body: Knee                Time: 0827-0902 OT Time Calculation (min): 35 min Charges:    G-Codes:     Diona Brownereresa Thompson OTS    08/04/2017, 9:27 AM

## 2017-08-04 NOTE — Progress Notes (Signed)
Physical Therapy Treatment Patient Details Name: Vanessa Hamilton MRN: [id number] DOB: August 26, 1940 Today's Date: 08/04/2017    History of Present Illness Pt is a 77 y/o female s/p elective R TKA. PMH includes HTN and DM.     PT Comments    Pt asleep in chair upon arrival and drowsy at beginning of session but is alert with mobility. Pt tolerated gait/stair training well and will continue to benefit from further skilled PT services to maximize independence and safety with mobility.    Follow Up Recommendations  Follow surgeon's recommendation for DC plan and follow-up therapies;Supervision for mobility/OOB     Equipment Recommendations  Rolling walker with 5" wheels;3in1 (PT)    Recommendations for Other Services OT consult     Precautions / Restrictions Precautions Precautions: Knee Precaution Comments: Reviewed knee precautions with pt Restrictions Weight Bearing Restrictions: Yes RLE Weight Bearing: Weight bearing as tolerated    Mobility  Bed Mobility Overal bed mobility: Needs Assistance Bed Mobility: Supine to Sit     Supine to sit: Min assist     General bed mobility comments: in chair on arrival; assist to bring R LE into bed  Transfers Overall transfer level: Needs assistance Equipment used: Rolling walker (2 wheeled) Transfers: Sit to/from Stand Sit to Stand: Min guard         General transfer comment: increased time and effort needed; min guard for safety  Ambulation/Gait Ambulation/Gait assistance: Min guard Gait Distance (Feet): (57ft then 77ft) Assistive device: Rolling walker (2 wheeled) Gait Pattern/deviations: Decreased step length - left;Decreased stance time - right;Decreased stride length;Decreased weight shift to right;Antalgic;Trunk flexed;Step-through pattern Gait velocity: decreased   General Gait Details: cues for sequencing and posture   Stairs Stairs: Yes Stairs assistance: Min guard;Min assist Stair Management: One rail Right;Step to  pattern;Sideways Number of Stairs: 8 General stair comments: cues for sequencing and technique; R knee stable during single leg stance    Wheelchair Mobility    Modified Rankin (Stroke Patients Only)       Balance Overall balance assessment: Needs assistance Sitting-balance support: No upper extremity supported;Feet supported Sitting balance-Leahy Scale: Fair     Standing balance support: Bilateral upper extremity supported Standing balance-Leahy Scale: Poor                              Cognition Arousal/Alertness: Lethargic;Suspect due to medications(drowsy upon arrival but more alert during session) Behavior During Therapy: [facility] for tasks assessed/performed;Flat affect Overall Cognitive Status: No family/caregiver present to determine baseline cognitive functioning                                        Exercises      General Comments        Pertinent Vitals/Pain Pain Assessment: Faces Faces Pain Scale: Hurts little more Pain Location: R knee  Pain Descriptors / Indicators: Guarding;Sore Pain Intervention(s): Limited activity within patient's tolerance;Monitored during session;Repositioned    Home Living                      Prior Function            PT Goals (current goals can now be found in the care plan section) Acute Rehab PT Goals Patient Stated Goal: to go home  PT Goal Formulation: With patient Time For Goal Achievement: 08/16/17 Potential to Achieve  Goals: Good Progress towards PT goals: Progressing toward goals    Frequency    7X/week      PT Plan Current plan remains appropriate    Co-evaluation              AM-PAC PT "6 Clicks" Daily Activity  Outcome Measure  Difficulty turning over in bed (including adjusting bedclothes, sheets and blankets)?: Unable Difficulty moving from lying on back to sitting on the side of the bed? : Unable Difficulty sitting down on and standing up from a chair  with arms (e.g., wheelchair, bedside commode, etc,.)?: Unable Help needed moving to and from a bed to chair (including a wheelchair)?: A Little Help needed walking in hospital room?: A Little Help needed climbing 3-5 steps with a railing? : A Little 6 Click Score: 12    End of Session Equipment Utilized During Treatment: Gait belt Activity Tolerance: Patient tolerated treatment well Patient left: with call bell/phone within reach;in bed Nurse Communication: Mobility status PT Visit Diagnosis: Other abnormalities of gait and mobility (R26.89);Unsteadiness on feet (R26.81);Pain Pain - Right/Left: Right Pain - part of body: Knee     Time: 1414-1500 PT Time Calculation (min) (ACUTE ONLY): 46 min  Charges:  $Gait Training: 23-37 mins $Therapeutic Activity: 8-22 mins                    G Codes:       Erline LevineKellyn Eyoel Throgmorton, PTA Pager: [phone]     Vanessa EdouardKellyn R Ritika Hellickson 08/04/2017, 3:06 PM

## 2017-08-04 NOTE — Progress Notes (Signed)
Physical Therapy Treatment Patient Details Name: Vanessa Hamilton MRN: [id number] DOB: 07/14/1940 Today's Date: 08/04/2017    History of Present Illness Pt is a 77 y/o female s/p elective R TKA. PMH includes HTN and DM.     PT Comments    Patient is making progress toward PT goals. Pt tolerated ambulating 11900ft with RW and min guard/min A and able to ascend/descend 2 steps with min A. Continue stair training next session as pt has flight of stairs to bedrooms in daughter's home.    Follow Up Recommendations  Follow surgeon's recommendation for DC plan and follow-up therapies;Supervision for mobility/OOB     Equipment Recommendations  Rolling walker with 5" wheels;3in1 (PT)    Recommendations for Other Services OT consult     Precautions / Restrictions Precautions Precautions: Knee Precaution Comments: Reviewed knee precautions with pt Restrictions Weight Bearing Restrictions: Yes RLE Weight Bearing: Weight bearing as tolerated    Mobility  Bed Mobility Overal bed mobility: Needs Assistance Bed Mobility: Supine to Sit     Supine to sit: Mod assist;HOB elevated     General bed mobility comments: in chair on arrival  Transfers Overall transfer level: Needs assistance Equipment used: Rolling walker (2 wheeled) Transfers: Sit to/from Stand Sit to Stand: Min assist         General transfer comment: pt with safe hand placement; increased time to power up into standing but able to rise without assistance and then required assist to steady while transitioning hand placement   Ambulation/Gait Ambulation/Gait assistance: +2 safety/equipment;Min guard;Min assist(chair to follow) Gait Distance (Feet): 100 Feet Assistive device: Rolling walker (2 wheeled) Gait Pattern/deviations: Step-to pattern;Decreased step length - left;Decreased stance time - right;Decreased stride length;Decreased weight shift to right;Antalgic;Trunk flexed;Step-through pattern Gait velocity: decreased    General Gait Details: cues for increased bialt step lengths and step length symmetry, sequencing, and posture; assistance for advancement of RW to set cadence; pt with improving step through pattern   Stairs             Wheelchair Mobility    Modified Rankin (Stroke Patients Only)       Balance Overall balance assessment: Needs assistance Sitting-balance support: No upper extremity supported;Feet supported Sitting balance-Leahy Scale: Fair     Standing balance support: Bilateral upper extremity supported Standing balance-Leahy Scale: Poor Standing balance comment: Reliant on BUE support and external assist for balance                            Cognition Arousal/Alertness: Lethargic;Suspect due to medications(drowsy upon arrival but more alert during session) Behavior During Therapy: Flat affect Overall Cognitive Status: No family/caregiver present to determine baseline cognitive functioning                                 General Comments: pt aware of deficits/decline in functioning;verbalized she "does not feel strong enough to go home today"      Exercises Total Joint Exercises Marching in Standing: (for pregait activities)    General Comments        Pertinent Vitals/Pain Pain Assessment: Faces Faces Pain Scale: Hurts little more Pain Location: R knee  Pain Descriptors / Indicators: Guarding;Sore Pain Intervention(s): Limited activity within patient's tolerance;Monitored during session;Repositioned    Home Living Family/patient expects to be discharged to:: Private residence Living Arrangements: Alone Available Help at Discharge: Family;Available 24 hours/day(reports she will be staying  at her daughter's ) Type of Home: House Home Access: Level entry   Home Layout: Two level Home Equipment: Cane - single point      Prior Function Level of Independence: Independent with assistive device(s)      Comments: Used cane for  ambulation    PT Goals (current goals can now be found in the care plan section) Acute Rehab PT Goals Patient Stated Goal: to go home  PT Goal Formulation: With patient Time For Goal Achievement: 08/16/17 Potential to Achieve Goals: Good Progress towards PT goals: Progressing toward goals    Frequency    7X/week      PT Plan Current plan remains appropriate    Co-evaluation              AM-PAC PT "6 Clicks" Daily Activity  Outcome Measure  Difficulty turning over in bed (including adjusting bedclothes, sheets and blankets)?: Unable Difficulty moving from lying on back to sitting on the side of the bed? : Unable Difficulty sitting down on and standing up from a chair with arms (e.g., wheelchair, bedside commode, etc,.)?: Unable Help needed moving to and from a bed to chair (including a wheelchair)?: A Little Help needed walking in hospital room?: A Little Help needed climbing 3-5 steps with a railing? : A Little 6 Click Score: 12    End of Session Equipment Utilized During Treatment: Gait belt Activity Tolerance: Patient tolerated treatment well Patient left: in chair;with call bell/phone within reach Nurse Communication: Mobility status PT Visit Diagnosis: Other abnormalities of gait and mobility (R26.89);Unsteadiness on feet (R26.81);Pain Pain - Right/Left: Right Pain - part of body: Knee     Time: 0930-1000 PT Time Calculation (min) (ACUTE ONLY): 30 min  Charges:  $Gait Training: 23-37 mins                    G Codes:       Erline Levine, PTA Pager: [phone]     Carolynne Edouard 08/04/2017, 10:22 AM

## 2017-08-05 ENCOUNTER — Encounter (HOSPITAL_COMMUNITY): Payer: Self-pay | Admitting: Orthopedic Surgery

## 2017-08-05 LAB — CBC
HCT: 31.7 % — ABNORMAL LOW (ref 36.0–46.0)
HEMOGLOBIN: 10.3 g/dL — AB (ref 12.0–15.0)
MCH: 29.4 pg (ref 26.0–34.0)
MCHC: 32.5 g/dL (ref 30.0–36.0)
MCV: 90.6 fL (ref 78.0–100.0)
Platelets: 189 10*3/uL (ref 150–400)
RBC: 3.5 MIL/uL — AB (ref 3.87–5.11)
RDW: 13.4 % (ref 11.5–15.5)
WBC: 9.4 10*3/uL (ref 4.0–10.5)

## 2017-08-05 LAB — GLUCOSE, CAPILLARY
Glucose-Capillary: 103 mg/dL — ABNORMAL HIGH (ref 65–99)
Glucose-Capillary: 87 mg/dL (ref 65–99)
Glucose-Capillary: 114 mg/dL — ABNORMAL HIGH (ref 65–99)

## 2017-08-05 NOTE — Progress Notes (Signed)
Physical Therapy Treatment Patient Details Name: Vanessa Hamilton MRN: [id number] DOB: 08/21/1940 Today's Date: 08/05/2017    History of Present Illness Pt is a 77 y/o female s/p elective R TKA. PMH includes HTN and DM.     PT Comments    Pt performed gait training and progressed to step through pattern.  She increased distance but does present with minor knee instability on R side.  May benefit from using KI inbetween PT sessions when mobilizing alone.  Will f/u for progression of therapeutic exercises and gait training later this pm.     Follow Up Recommendations  Follow surgeon's recommendation for DC plan and follow-up therapies;Supervision for mobility/OOB     Equipment Recommendations  Rolling walker with 5" wheels;3in1 (PT)    Recommendations for Other Services       Precautions / Restrictions Precautions Precautions: Knee Precaution Booklet Issued: Yes (comment) Precaution Comments: Reviewed knee precautions with pt Restrictions Weight Bearing Restrictions: Yes RLE Weight Bearing: Weight bearing as tolerated    Mobility  Bed Mobility                  Transfers Overall transfer level: Needs assistance Equipment used: Rolling walker (2 wheeled) Transfers: Sit to/from Stand Sit to Stand: Min guard         General transfer comment: Cues for hand placement to and from seated surface.    Ambulation/Gait Ambulation/Gait assistance: Min guard Gait Distance (Feet): 150 Feet Assistive device: Rolling walker (2 wheeled) Gait Pattern/deviations: Decreased step length - left;Decreased stance time - right;Decreased stride length;Decreased weight shift to right;Antalgic;Trunk flexed;Step-through pattern;Step-to pattern     General Gait Details: Cues for progression to step through pattern.  Pt tolerated session well.  She does present with minor buckling in R stance phase but with cues for R heel strike this improves.     Stairs             Wheelchair  Mobility    Modified Rankin (Stroke Patients Only)       Balance Overall balance assessment: Needs assistance   Sitting balance-Leahy Scale: Fair Sitting balance - Comments: posterior lean, requiring cues to correct     Standing balance-Leahy Scale: Poor                              Cognition Arousal/Alertness: Awake/alert Behavior During Therapy: WFL for tasks assessed/performed Overall Cognitive Status: Within Functional Limits for tasks assessed                                        Exercises Total Joint Exercises Long Arc Quad: AROM;Right;10 reps;Seated    General Comments        Pertinent Vitals/Pain Pain Assessment: 0-10 Pain Score: 2  Pain Location: R knee  Pain Descriptors / Indicators: Sore Pain Intervention(s): Monitored during session;Repositioned;Ice applied    Home Living                      Prior Function            PT Goals (current goals can now be found in the care plan section) Acute Rehab PT Goals Patient Stated Goal: to go home  Potential to Achieve Goals: Good Progress towards PT goals: Progressing toward goals    Frequency    7X/week      PT  Plan Current plan remains appropriate    Co-evaluation              AM-PAC PT "6 Clicks" Daily Activity  Outcome Measure  Difficulty turning over in bed (including adjusting bedclothes, sheets and blankets)?: Unable Difficulty moving from lying on back to sitting on the side of the bed? : Unable Difficulty sitting down on and standing up from a chair with arms (e.g., wheelchair, bedside commode, etc,.)?: Unable Help needed moving to and from a bed to chair (including a wheelchair)?: A Little Help needed walking in hospital room?: A Little Help needed climbing 3-5 steps with a railing? : A Little 6 Click Score: 12    End of Session Equipment Utilized During Treatment: Gait belt Activity Tolerance: Patient tolerated treatment well Patient  left: with call bell/phone within reach;in bed Nurse Communication: Mobility status PT Visit Diagnosis: Other abnormalities of gait and mobility (R26.89);Unsteadiness on feet (R26.81);Pain Pain - Right/Left: Right Pain - part of body: Knee     Time: [id number] PT Time Calculation (min) (ACUTE ONLY): 28 min  Charges:  $Gait Training: 8-22 mins $Therapeutic Activity: 8-22 mins                    G Codes:       Vanessa Hamilton, PTA pager [phone]    Vanessa Hamilton 08/05/2017, 1:49 PM

## 2017-08-05 NOTE — Progress Notes (Signed)
Physical Therapy Treatment Patient Details Name: Vanessa KaufmannJean Hamilton MRN: [id number] DOB: 02/20/1940 Today's Date: 08/05/2017    History of Present Illness Pt is a 77 y/o female s/p elective R TKA. PMH includes HTN and DM.     PT Comments    Pt performed treatment this pm with focus on stair training as she is planned for d/c this evening.  Pt informed PTA that she does not have her equipment in her room.  PTA informed nurse and floor CM.  Call placed to after hour number to inform that patient's equipment has not been delivered.  Pt reviewed stair and did well this afternoon and is safe to d/c home from a mobility standpoint.  However at this time she will need equipment to leave safely and this has not been delivered.      Follow Up Recommendations  Follow surgeon's recommendation for DC plan and follow-up therapies;Supervision for mobility/OOB     Equipment Recommendations  Rolling walker with 5" wheels;3in1 (PT)    Recommendations for Other Services       Precautions / Restrictions Precautions Precautions: Knee Precaution Booklet Issued: Yes (comment) Precaution Comments: Reviewed knee precautions with pt Restrictions Weight Bearing Restrictions: Yes RLE Weight Bearing: Weight bearing as tolerated    Mobility  Bed Mobility               General bed mobility comments: in chair on arrival  Transfers Overall transfer level: Needs assistance Equipment used: Rolling walker (2 wheeled) Transfers: Sit to/from Stand Sit to Stand: Supervision         General transfer comment: Cues for hand placement to and from seated surface.    Ambulation/Gait Ambulation/Gait assistance: Min guard Gait Distance (Feet): (steps in gym to and from stair training stations, ~20 ft max.  ) Assistive device: Rolling walker (2 wheeled) Gait Pattern/deviations: Decreased step length - left;Decreased stance time - right;Decreased stride length;Decreased weight shift to right;Antalgic;Trunk  flexed;Step-through pattern Gait velocity: decreased   General Gait Details: Cues for sequencing and safety.   Stairs Stairs: Yes Stairs assistance: Min guard Stair Management: One rail Right;Step to pattern;Sideways;With walker;Forwards Number of Stairs: 12(+1 trial of curb training for safe entry into home.  ) General stair comments: cues for sequencing and technique; R knee stable during single leg stance, Pt performed x12 stairs sideways and x1 curb forward direction   Wheelchair Mobility    Modified Rankin (Stroke Patients Only)       Balance Overall balance assessment: Needs assistance   Sitting balance-Leahy Scale: Good Sitting balance - Comments: posterior lean, requiring cues to correct     Standing balance-Leahy Scale: Fair                              Cognition Arousal/Alertness: Awake/alert Behavior During Therapy: WFL for tasks assessed/performed Overall Cognitive Status: Within Functional Limits for tasks assessed                                        Exercises Total Joint Exercises Long Arc Quad: AROM;Right;10 reps;Seated    General Comments        Pertinent Vitals/Pain Pain Assessment: 0-10 Pain Score: 2  Pain Location: R knee  Pain Descriptors / Indicators: Sore Pain Intervention(s): Monitored during session;Repositioned    Home Living  Prior Function            PT Goals (current goals can now be found in the care plan section) Acute Rehab PT Goals Patient Stated Goal: to go home  Potential to Achieve Goals: Good Progress towards PT goals: Progressing toward goals    Frequency    7X/week      PT Plan Current plan remains appropriate    Co-evaluation              AM-PAC PT "6 Clicks" Daily Activity  Outcome Measure  Difficulty turning over in bed (including adjusting bedclothes, sheets and blankets)?: Unable Difficulty moving from lying on back to sitting on  the side of the bed? : Unable Difficulty sitting down on and standing up from a chair with arms (e.g., wheelchair, bedside commode, etc,.)?: Unable Help needed moving to and from a bed to chair (including a wheelchair)?: A Little Help needed walking in hospital room?: A Little Help needed climbing 3-5 steps with a railing? : A Little 6 Click Score: 12    End of Session Equipment Utilized During Treatment: Gait belt Activity Tolerance: Patient tolerated treatment well Patient left: with call bell/phone within reach;in bed Nurse Communication: Mobility status PT Visit Diagnosis: Other abnormalities of gait and mobility (R26.89);Unsteadiness on feet (R26.81);Pain Pain - Right/Left: Right Pain - part of body: Knee     Time: 1610-9604 PT Time Calculation (min) (ACUTE ONLY): 15 min  Charges:  $Gait Training: 8-22 mins $Therapeutic Activity: 8-22 mins                    G Codes:       Joycelyn Rua, PTA pager [phone]    Florestine Avers 08/05/2017, 5:31 PM

## 2017-08-05 NOTE — Progress Notes (Signed)
PATIENT ID: Vanessa KaufmannJean Hamilton  MRN: [id number]  DOB/AGE:  07/04/1940 / 77 y.o.  3 Days Post-Op Procedure(s) (LRB): RIGHT TOTAL KNEE ARTHROPLASTY (Right)    PROGRESS NOTE Subjective: Patient is alert, oriented, no Nausea, no Vomiting, yes passing gas. Taking PO well. Denies SOB, Chest or Calf Pain. Using Incentive Spirometer, PAS in place. Ambulate WBAT with pt walking 100 ft and practicing stairs yesterday, Patient reports pain as 1/10 .    Objective: Vital signs in last 24 hours: Vitals:   08/03/17 2114 08/04/17 0341 08/04/17 2145 08/05/17 0437  BP: (!) 184/67 135/65 (!) 153/61 (!) 109/53  Pulse: 93 84 87 74  Resp:  15 20 20   Temp:  98.5 F (36.9 C) 99.3 F (37.4 C) 98.6 F (37 C)  TempSrc:  Oral Oral Oral  SpO2:  96% 95% 91%  Weight:      Height:          Intake/Output from previous day: I/O last 3 completed shifts: In: -  Out: 1600 [Urine:1600]   Intake/Output this shift: No intake/output data recorded.   LABORATORY DATA: Recent Labs    08/03/17 0614  08/04/17 0231  08/04/17 1623 08/04/17 2145 08/05/17 0316 08/05/17 0703  WBC 8.0  --  10.3  --   --   --  9.4  --   HGB 11.0*  --  11.6*  --   --   --  10.3*  --   HCT 34.3*  --  36.2  --   --   --  31.7*  --   PLT 248  --  224  --   --   --  189  --   NA 136  --   --   --   --   --   --   --   K 4.2  --   --   --   --   --   --   --   CL 104  --   --   --   --   --   --   --   CO2 25  --   --   --   --   --   --   --   BUN 7  --   --   --   --   --   --   --   CREATININE 0.70  --   --   --   --   --   --   --   GLUCOSE 166*  --   --   --   --   --   --   --   GLUCAP  --    < >  --    < > 112* 152*  --  103*  CALCIUM 8.9  --   --   --   --   --   --   --    < > = values in this interval not displayed.    Examination: Neurologically intact Neurovascular intact Sensation intact distally Intact pulses distally Dorsiflexion/Plantar flexion intact Incision: dressing C/D/I and no drainage No cellulitis  present Compartment soft}  Assessment:   3 Days Post-Op Procedure(s) (LRB): RIGHT TOTAL KNEE ARTHROPLASTY (Right) ADDITIONAL DIAGNOSIS: Expected Acute Blood Loss Anemia, Diabetes and Hypertension Anticipated LOS equal to or greater than 2 midnights due to - Age 78 and older with one or more of the following:  - Obesity  - Expected need for hospital services (PT, OT,  Nursing) required for safe  discharge  - Anticipated need for postoperative skilled nursing care or inpatient rehab  - Active co-morbidities: Diabetes    Plan: PT/OT WBAT, AROM and PROM  DVT Prophylaxis:  SCDx72hrs, ASA 325 mg BID x 2 weeks DISCHARGE PLAN: Home, later today if she passes therapy goals DISCHARGE NEEDS: HHPT, Walker and 3-in-1 comode seat     Dannielle Burn 08/05/2017, 10:14 AM

## 2017-08-05 NOTE — Progress Notes (Addendum)
Occupational Therapy Treatment Patient Details Name: Vanessa Hamilton MRN: [id number] DOB: October 14, 1940 Today's Date: 08/05/2017    History of present illness Pt is a 77 y/o female s/p elective R TKA. PMH includes HTN and DM.    OT comments  OT received pt in therapy gym working with PT on stairs this afternoon. Pt seen for second session as she is planning to D/C home this afternoon once equipment arrives. Pt educated concerning use of AE for LB ADL participation and she was able to return demonstration with overall min assist. Continue to recommend 24 hour assistance/supervision to maximize safety at home. She verbalizes understanding. OT will continue to follow while admitted.    Follow Up Recommendations  Follow surgeon's recommendation for DC plan and follow-up therapies;Supervision/Assistance - 24 hour    Equipment Recommendations  None recommended by OT    Recommendations for Other Services PT consult    Precautions / Restrictions Precautions Precautions: Knee Precaution Comments: Reviewed knee precautions related to ADL.  Restrictions Weight Bearing Restrictions: Yes RLE Weight Bearing: Weight bearing as tolerated       Mobility Bed Mobility               General bed mobility comments: OOB in therapy gym on my arrival.   Transfers Overall transfer level: Needs assistance Equipment used: Rolling walker (2 wheeled) Transfers: Sit to/from Stand Sit to Stand: Min guard         General transfer comment: Min guard assist for safety wtih cues for hand placement.     Balance Overall balance assessment: Needs assistance Sitting-balance support: No upper extremity supported;Feet supported Sitting balance-Leahy Scale: Good     Standing balance support: Bilateral upper extremity supported;No upper extremity supported;During functional activity Standing balance-Leahy Scale: Poor Standing balance comment: Relies on UE support during standing tasks.                             ADL either performed or assessed with clinical judgement   ADL Overall ADL's : Needs assistance/impaired                     Lower Body Dressing: Minimal assistance;Sit to/from stand Lower Body Dressing Details (indicate cue type and reason): Educated pt concerning use of reacher and sock aide for LB dressing tasks. She required min assist with sock aide to pull over foot and does have some difficulty with dorsiflexion/plantarflexion to adjust clothing over R foot. Provided handout with images of AE that would be helpful.  Toilet Transfer: Min guard;Ambulation;BSC;RW;Comfort height toilet Toilet Transfer Details (indicate cue type and reason): BSC over toilet; cues for safe use of RW Toileting- Clothing Manipulation and Hygiene: Min guard;Sit to/from stand   Tub/ Engineer, structural: Moderate assistance;Ambulation;Walk-in shower;3 in Scientist, water quality Details (indicate cue type and reason): Decreased stability; step-by-step cues required Functional mobility during ADLs: Min guard;Rolling walker General ADL Comments: Pt received in therapy gym working with PTA. Pt planning to d/c home this afternoon. Thus, OT completing second session to address LB dressing compensatory strategies and use of AE. She was able to demonstrate with min assist.      Vision       Perception     Praxis      Cognition Arousal/Alertness: Awake/alert Behavior During Therapy: WFL for tasks assessed/performed Overall Cognitive Status: Within Functional Limits for tasks assessed  Exercises     Shoulder Instructions       General Comments Pt reports that she plans to D/C home today [facility]     Pertinent Vitals/ Pain       Pain Assessment: Faces Pain Score: 2  Faces Pain Scale: Hurts a little bit Pain Location: R knee  Pain Descriptors / Indicators: Sore Pain Intervention(s):  Monitored during session;Repositioned  Home Living                                          Prior Functioning/Environment              Frequency  Min 2X/week        Progress Toward Goals  OT Goals(current goals can now be found in the care plan section)  Progress towards OT goals: Progressing toward goals  Acute Rehab OT Goals Patient Stated Goal: to go home  OT Goal Formulation: With patient Time For Goal Achievement: 08/18/17 Potential to Achieve Goals: Good ADL Goals Pt Will Perform Lower Body Dressing: with modified independence;with adaptive equipment;sit to/from stand  Plan Discharge plan remains appropriate    Co-evaluation                 AM-PAC PT "6 Clicks" Daily Activity     Outcome Measure   Help from another person eating meals?: None Help from another person taking care of personal grooming?: A Little Help from another person toileting, which includes using toliet, bedpan, or urinal?: A Little Help from another person bathing (including washing, rinsing, drying)?: A Little Help from another person to put on and taking off regular upper body clothing?: A Little Help from another person to put on and taking off regular lower body clothing?: A Little 6 Click Score: 19    End of Session Equipment Utilized During Treatment: Gait belt  OT Visit Diagnosis: Unsteadiness on feet (R26.81);Other abnormalities of gait and mobility (R26.89);Pain Pain - Right/Left: Right Pain - part of body: Knee   Activity Tolerance Patient tolerated treatment well   Patient Left in chair;with call bell/phone within reach   Nurse Communication Mobility status        Time: 1610-96041510-1525 OT Time Calculation (min): 15 min  Charges: OT General Charges $OT Visit: 1 Visit OT Treatments $Self Care/Home Management : 8-22 mins  Doristine Sectionharity A Taevon Aschoff, MS OTR/L  Pager: [phone]    Vanessa Hamilton 08/05/2017, 6:45 PM

## 2017-08-05 NOTE — Discharge Summary (Signed)
Patient ID: Vanessa Hamilton MRN: [id number] DOB/AGE: [birth year] 77 y.o.  Admit date: 08/02/2017 Discharge date: 08/05/2017  Admission Diagnoses:  Principal Problem:   Osteoarthritis of right knee Active Problems:   Primary osteoarthritis of right knee   Discharge Diagnoses:  Same  Past Medical History:  Diagnosis Date  . Arthritis   . Diabetes mellitus without complication (HCC)    Type II  . Hypertension   . IBS (irritable bowel syndrome)   . Osteoarthritis of right knee     Surgeries: Procedure(s): RIGHT TOTAL KNEE ARTHROPLASTY on 08/02/2017   Consultants:   Discharged Condition: Improved  Hospital Course: Vanessa Hamilton is an 77 y.o. female who was admitted 08/02/2017 for operative treatment ofOsteoarthritis of right knee. Patient has severe unremitting pain that affects sleep, daily activities, and work/hobbies. After pre-op clearance the patient was taken to the operating room on 08/02/2017 and underwent  Procedure(s): RIGHT TOTAL KNEE ARTHROPLASTY.    Patient was given perioperative antibiotics:  Anti-infectives (From admission, onward)   Start     Dose/Rate Route Frequency Ordered Stop   08/02/17 0715  ceFAZolin (ANCEF) IVPB 2g/100 mL premix     2 g 200 mL/hr over 30 Minutes Intravenous On call to O.R. 08/02/17 5784 08/02/17 0910       Patient was given sequential compression devices, early ambulation, and chemoprophylaxis to prevent DVT.  Patient benefited maximally from hospital stay and there were no complications.    Recent vital signs:  Patient Vitals for the past 24 hrs:  BP Temp Temp src Pulse Resp SpO2  08/05/17 1428 (!) 134/56 98 F (36.7 C) Oral 77 17 96 %  08/05/17 0437 (!) 109/53 98.6 F (37 C) Oral 74 20 91 %  08/04/17 2145 (!) 153/61 99.3 F (37.4 C) Oral 87 20 95 %     Recent laboratory studies:  Recent Labs    08/03/17 0614 08/04/17 0231 08/05/17 0316  WBC 8.0 10.3 9.4  HGB 11.0* 11.6* 10.3*  HCT 34.3* 36.2 31.7*  PLT 248 224 189  NA 136   --   --   K 4.2  --   --   CL 104  --   --   CO2 25  --   --   BUN 7  --   --   CREATININE 0.70  --   --   GLUCOSE 166*  --   --   CALCIUM 8.9  --   --      Discharge Medications:   Allergies as of 08/05/2017      Reactions   Atorvastatin Anaphylaxis   Losartan Swelling   SWELLING REACTION UNSPECIFIED    Ramipril Swelling   SWELLING REACTION UNSPECIFIED      Medication List    STOP taking these medications   TYLENOL 8 HOUR ARTHRITIS PAIN 650 MG CR tablet Generic drug:  acetaminophen     TAKE these medications   amLODipine 5 MG tablet Commonly known as:  NORVASC Take 5 mg by mouth daily.   aspirin EC 81 MG tablet Take 1 tablet (81 mg total) by mouth 2 (two) times daily.   Fish Oil 1200 MG Caps Take 1,200 mg by mouth daily.   metFORMIN 500 MG tablet Commonly known as:  GLUCOPHAGE Take 1,000 mg by mouth 2 (two) times daily.   multivitamin with minerals Tabs tablet Take 1 tablet by mouth daily.   oxyCODONE-acetaminophen 5-325 MG tablet Commonly known as:  PERCOCET/ROXICET Take 1 tablet by mouth every 4 (four) hours  as needed for severe pain.   pravastatin 80 MG tablet Commonly known as:  PRAVACHOL Take 80 mg by mouth at bedtime.   timolol 0.5 % ophthalmic gel-forming Commonly known as:  TIMOPTIC-XR Place 1 drop into the right eye daily.   tiZANidine 2 MG tablet Commonly known as:  ZANAFLEX Take 1 tablet (2 mg total) by mouth every 6 (six) hours as needed.   Travoprost (BAK Free) 0.004 % Soln ophthalmic solution Commonly known as:  TRAVATAN Place 1 drop into both eyes at bedtime.            Durable Medical Equipment  (From admission, onward)        Start     Ordered   08/02/17 1149  DME Walker rolling  Once    Question:  Patient needs a walker to treat with the following condition  Answer:  Status post right knee replacement   08/02/17 1149   08/02/17 1149  DME 3 n 1  Once     08/02/17 1149       Discharge Care Instructions  (From  admission, onward)        Start     Ordered   08/05/17 0000  Weight bearing as tolerated     08/05/17 1621      Diagnostic Studies: Dg Chest 2 View  Result Date: 07/22/2017 CLINICAL DATA:  Preop for total knee arthroplasty. EXAM: CHEST - 2 VIEW COMPARISON:  None. FINDINGS: The heart size and mediastinal contours are within normal limits. Both lungs are clear. The visualized skeletal structures are unremarkable. IMPRESSION: No active cardiopulmonary disease. Electronically Signed   By: Lupita RaiderJames  Green Jr, M.D.   On: 07/22/2017 10:30    Disposition: Discharge disposition: 01-Home or Self Care       Discharge Instructions    Call MD / Call 911   Complete by:  As directed    If you experience chest pain or shortness of breath, CALL 911 and be transported to the hospital emergency room.  If you develope a fever above 101 F, pus (white drainage) or increased drainage or redness at the wound, or calf pain, call your surgeon's office.   Constipation Prevention   Complete by:  As directed    Drink plenty of fluids.  Prune juice may be helpful.  You may use a stool softener, such as Colace (over the counter) 100 mg twice a day.  Use MiraLax (over the counter) for constipation as needed.   Diet - low sodium heart healthy   Complete by:  As directed    Driving restrictions   Complete by:  As directed    No driving for 2 weeks   Increase activity slowly as tolerated   Complete by:  As directed    Patient may shower   Complete by:  As directed    You may shower without a dressing once there is no drainage.  Do not wash over the wound.  If drainage remains, cover wound with plastic wrap and then shower.   Weight bearing as tolerated   Complete by:  As directed       Follow-up Information    Gean Birchwoodowan, Frank, MD In 2 weeks.   Specialty:  Orthopedic Surgery Contact information: [address] [place] [address] [phone]            Signed: Dannielle Burnric [name] 08/05/2017, 4:21  PM

## 2017-08-05 NOTE — Plan of Care (Signed)

## 2017-08-05 NOTE — Progress Notes (Signed)
Occupational Therapy Treatment Patient Details Name: Vanessa KaufmannJean Hamilton MRN: [id number] DOB: 11/07/1940 Today's Date: 08/05/2017    History of present illness Pt is a 77 y/o female s/p elective R TKA. PMH includes HTN and DM.    OT comments  Pt demonstrating progress toward OT goals this session. She was able to complete ambulating toilet transfers with improved independence requiring min guard assist only this session. She was additionally able to complete simulated walk-in shower transfers this session with mod assist. Educated pt concerning need for assistance and recommendation that she wait until cleared by home health therapies before completing shower transfers with assistance from family. She verbalizes and demonstrates understanding. Will continue to follow while admitted.    Follow Up Recommendations  Follow surgeon's recommendation for DC plan and follow-up therapies;Supervision/Assistance - 24 hour    Equipment Recommendations  None recommended by OT    Recommendations for Other Services PT consult    Precautions / Restrictions Precautions Precautions: Knee Precaution Comments: Reviewed knee precautions related to ADL.  Restrictions Weight Bearing Restrictions: Yes RLE Weight Bearing: Weight bearing as tolerated       Mobility Bed Mobility               General bed mobility comments: OOB in recliner on my arrival.   Transfers Overall transfer level: Needs assistance Equipment used: Rolling walker (2 wheeled) Transfers: Sit to/from Stand Sit to Stand: Min guard         General transfer comment: Min guard assist for safety wtih cues for hand placement.     Balance Overall balance assessment: Needs assistance Sitting-balance support: No upper extremity supported;Feet supported Sitting balance-Leahy Scale: Good     Standing balance support: Bilateral upper extremity supported;No upper extremity supported;During functional activity Standing balance-Leahy Scale:  Poor Standing balance comment: Relies on UE support during standing tasks.                            ADL either performed or assessed with clinical judgement   ADL Overall ADL's : Needs assistance/impaired                         Toilet Transfer: Min guard;Ambulation;BSC;RW;Comfort height toilet Toilet Transfer Details (indicate cue type and reason): BSC over toilet; cues for safe use of RW Toileting- Clothing Manipulation and Hygiene: Min guard;Sit to/from stand   Tub/ Engineer, structuralhower Transfer: Moderate assistance;Ambulation;Walk-in shower;3 in Scientist, water quality1;Rolling walker Tub/Shower Transfer Details (indicate cue type and reason): Decreased stability; step-by-step cues required Functional mobility during ADLs: Min guard;Rolling walker General ADL Comments: Pt demonstrating improvement in ability to participate in ADL and functional mobility. She is moving slowly but able to participate in toilet transfers and simulated shower transfers. Recommended that pt not attempt showering until cleared for shower transfers by HHPT.      Vision       Perception     Praxis      Cognition Arousal/Alertness: Awake/alert Behavior During Therapy: WFL for tasks assessed/performed Overall Cognitive Status: Within Functional Limits for tasks assessed                                          Exercises     Shoulder Instructions       General Comments Pt reports that her daughter works during the day but her grandsons  will be able to assist. They are 17 and 23.     Pertinent Vitals/ Pain       Pain Assessment: Faces Pain Score: 2  Faces Pain Scale: Hurts little more Pain Location: R knee  Pain Descriptors / Indicators: Sore Pain Intervention(s): Monitored during session;Repositioned  Home Living                                          Prior Functioning/Environment              Frequency  Min 2X/week        Progress Toward Goals  OT  Goals(current goals can now be found in the care plan section)  Progress towards OT goals: Progressing toward goals  Acute Rehab OT Goals Patient Stated Goal: to go home  OT Goal Formulation: With patient Time For Goal Achievement: 08/18/17 Potential to Achieve Goals: Good  Plan Discharge plan remains appropriate    Co-evaluation                 AM-PAC PT "6 Clicks" Daily Activity     Outcome Measure   Help from another person eating meals?: None Help from another person taking care of personal grooming?: A Little Help from another person toileting, which includes using toliet, bedpan, or urinal?: A Little Help from another person bathing (including washing, rinsing, drying)?: A Little Help from another person to put on and taking off regular upper body clothing?: A Little Help from another person to put on and taking off regular lower body clothing?: A Little 6 Click Score: 19    End of Session Equipment Utilized During Treatment: Gait belt  OT Visit Diagnosis: Unsteadiness on feet (R26.81);Other abnormalities of gait and mobility (R26.89);Pain Pain - Right/Left: Right Pain - part of body: Knee   Activity Tolerance Patient tolerated treatment well   Patient Left in chair;with call bell/phone within reach;Other (comment)(with ice on knee)   Nurse Communication Mobility status        Time: 7829-5621 OT Time Calculation (min): 17 min  Charges: OT General Charges $OT Visit: 1 Visit OT Treatments $Self Care/Home Management : 8-22 mins  Doristine Section, MS OTR/L  Pager: [phone]    Vanessa Hamilton 08/05/2017, 6:11 PM

## 2017-08-05 NOTE — Plan of Care (Signed)
  Problem: Clinical Measurements: Goal: Ability to maintain clinical measurements within normal limits will improve 08/05/2017 1755 by Darreld Mcleanox, Rakia Frayne, RN Outcome: Adequate for Discharge 08/05/2017 1125 by Darreld Mcleanox, Sheyenne Konz, RN Outcome: Progressing Goal: Will remain free from infection 08/05/2017 1755 by Darreld Mcleanox, Sahir Tolson, RN Outcome: Adequate for Discharge 08/05/2017 1125 by Darreld Mcleanox, Arnetha Silverthorne, RN Outcome: Progressing Goal: Diagnostic test results will improve 08/05/2017 1755 by Darreld Mcleanox, Decarla Siemen, RN Outcome: Adequate for Discharge 08/05/2017 1125 by Darreld Mcleanox, Terrelle Ruffolo, RN Outcome: Progressing Goal: Respiratory complications will improve 08/05/2017 1755 by Darreld Mcleanox, Darnella Zeiter, RN Outcome: Adequate for Discharge 08/05/2017 1125 by Darreld Mcleanox, Mark Benecke, RN Outcome: Progressing Goal: Cardiovascular complication will be avoided 08/05/2017 1755 by Darreld Mcleanox, Nadia Viar, RN Outcome: Adequate for Discharge 08/05/2017 1125 by Darreld Mcleanox, Davonne Baby, RN Outcome: Progressing

## 2019-11-09 IMAGING — CR DG CHEST 2V
2 series · 2 of 2 positions shown · non-contrast
Comparison: None.

CLINICAL DATA: Preop for total knee arthroplasty.

EXAM:
CHEST - 2 VIEW

[w chest pa]
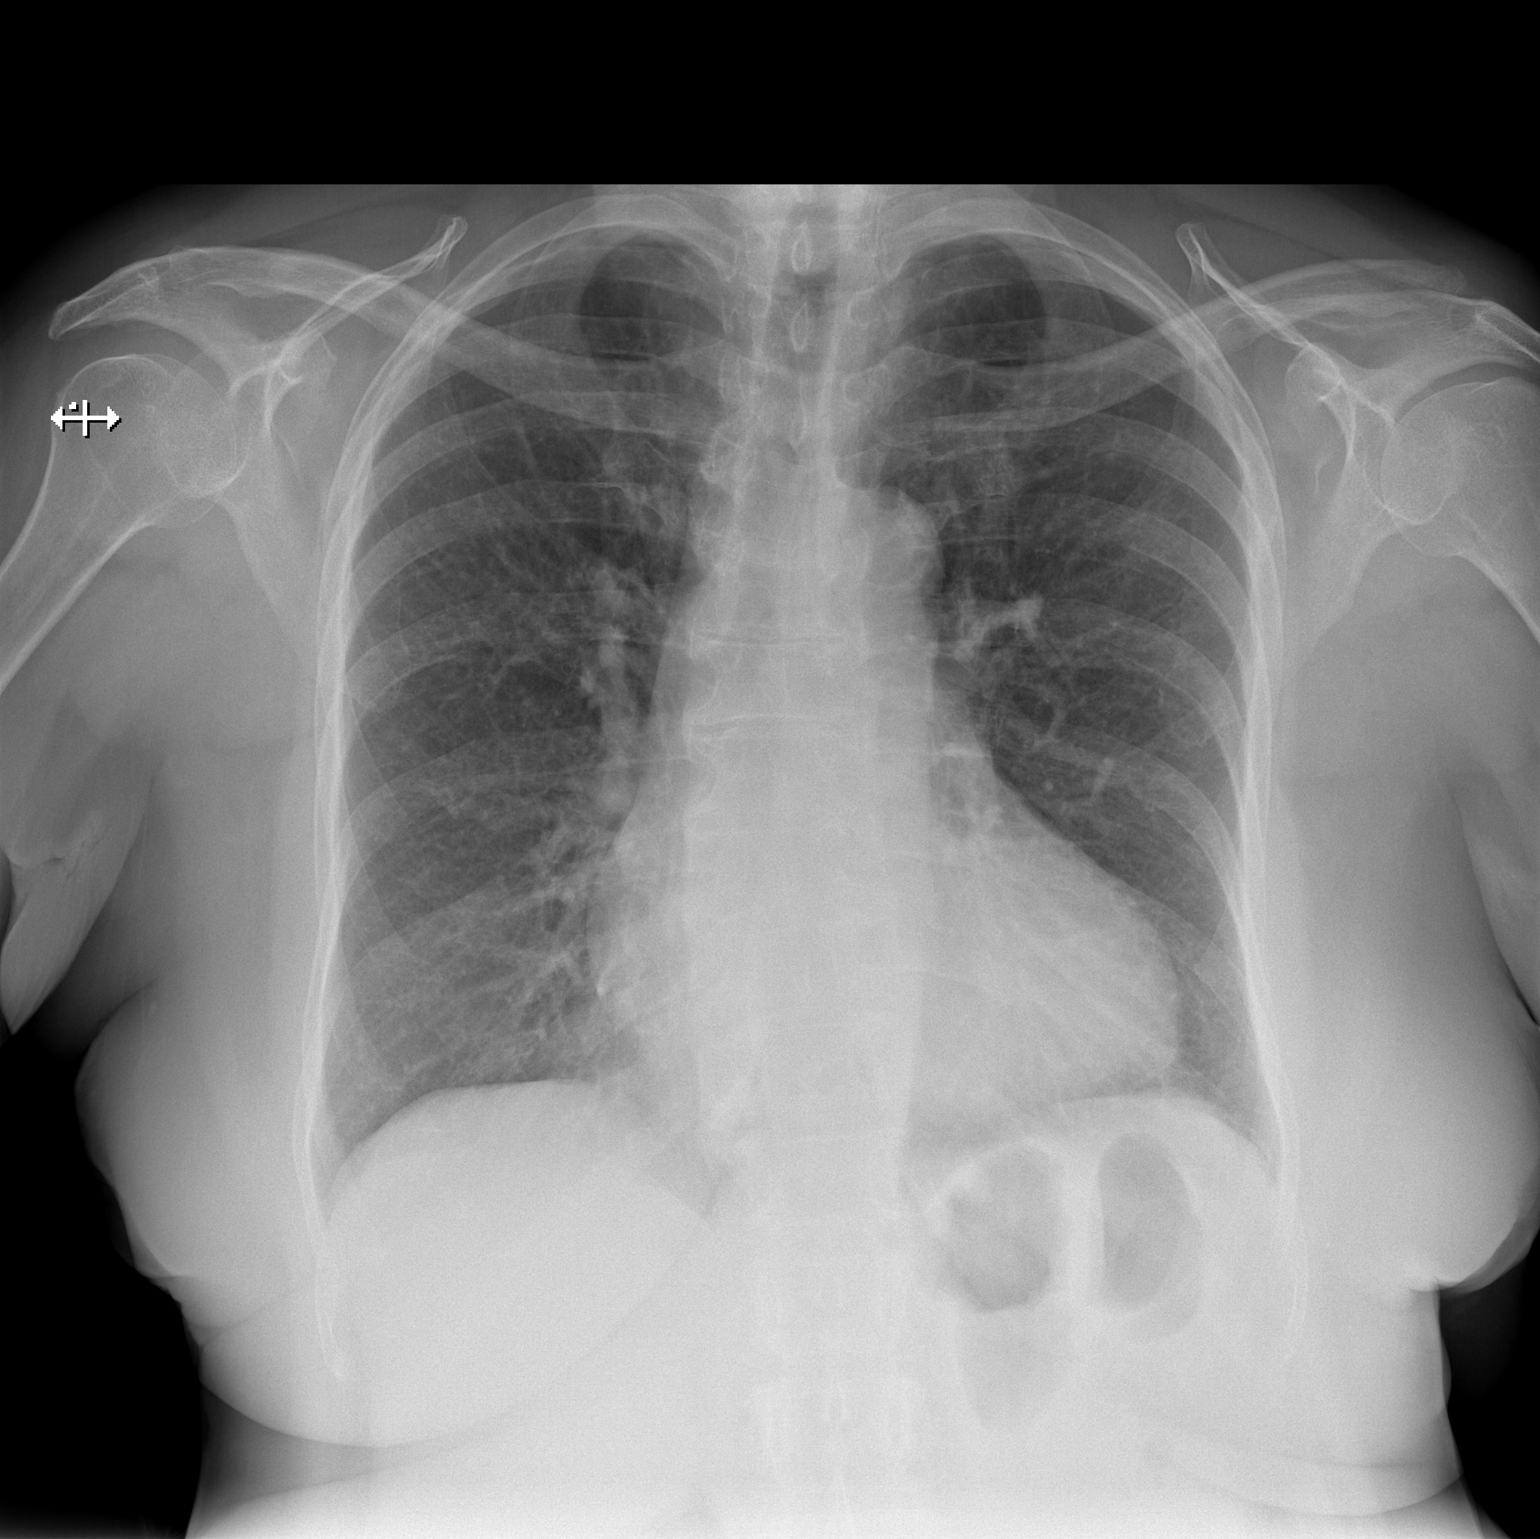

[w chest lat]
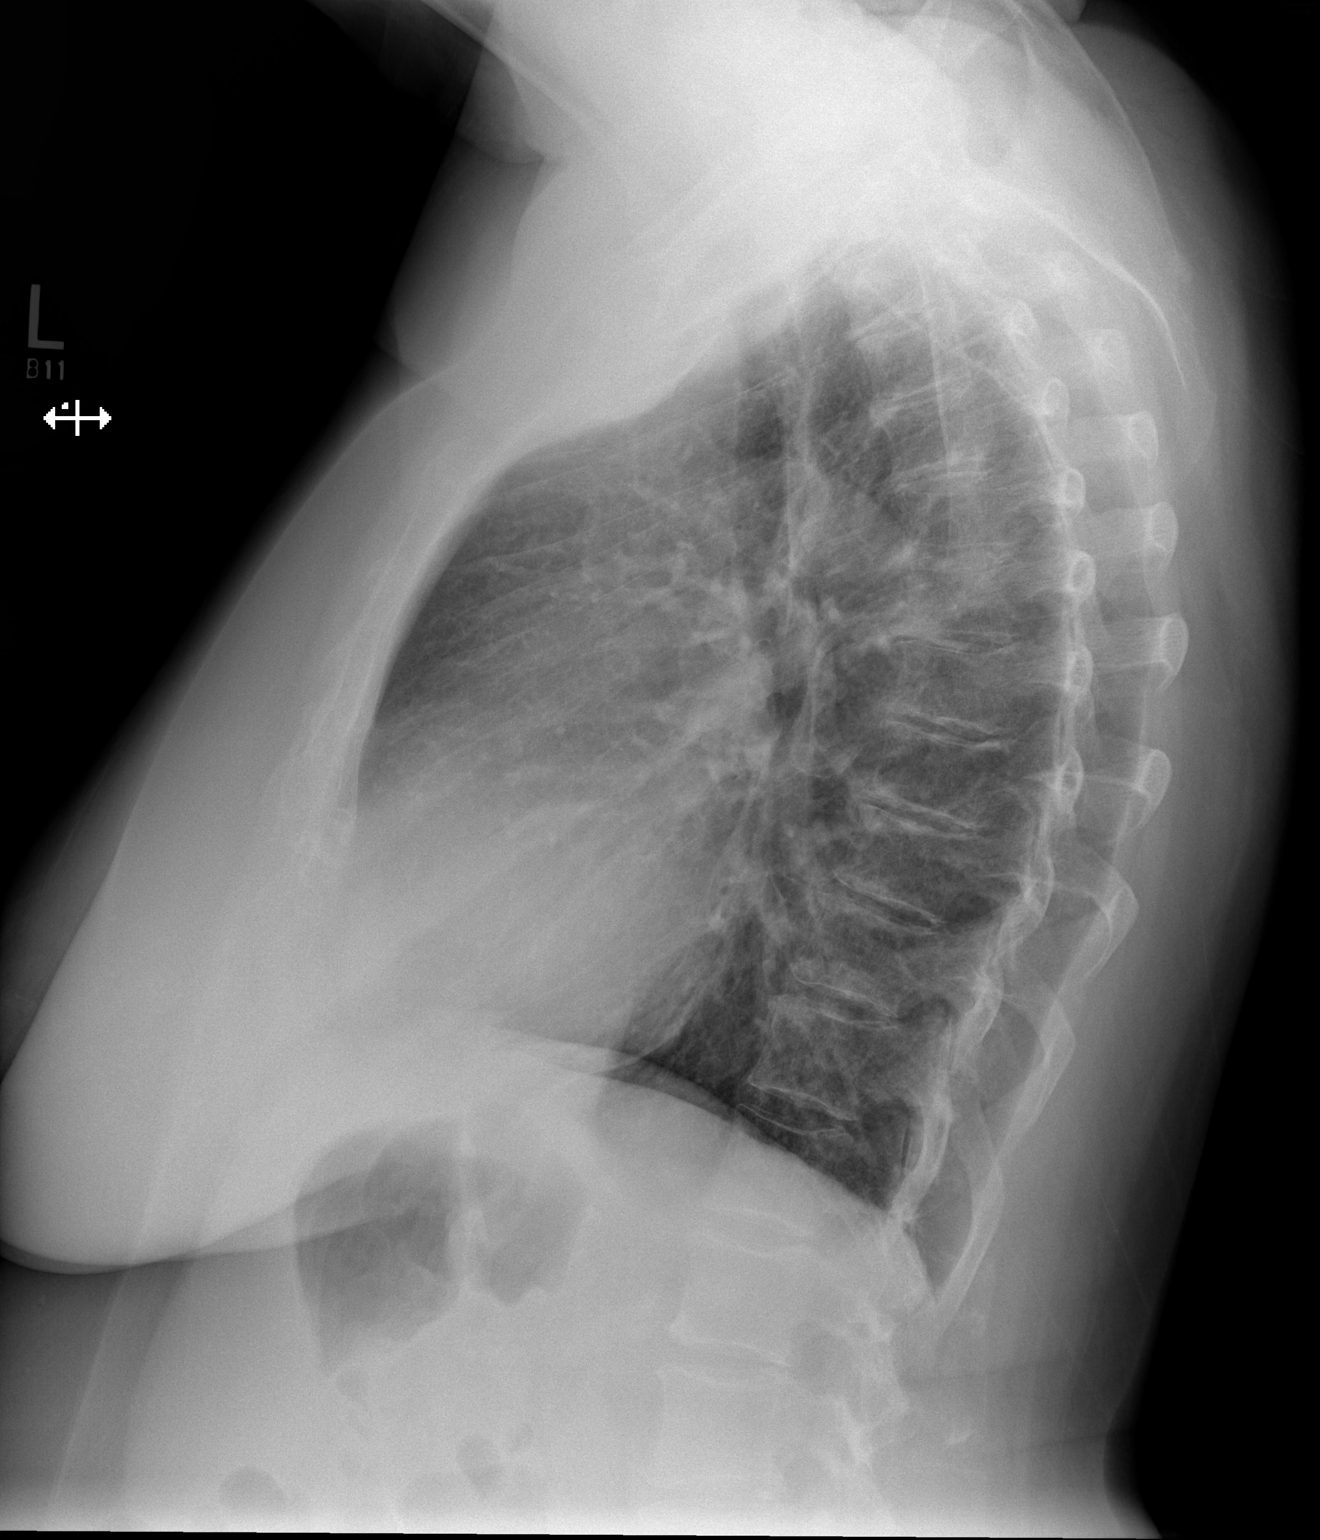

[2 of 2 positions shown; findings below may reference images not displayed]

FINDINGS: The heart size and mediastinal contours are within normal limits.
Both lungs are clear. The visualized skeletal structures are
unremarkable.
IMPRESSION: No active cardiopulmonary disease.
